# Patient Record
Sex: Male | Born: 1955 | Race: Black or African American | Hispanic: No | Marital: Single | Smoking: Current every day smoker
Health system: Southern US, Community
[De-identification: ages and names within clinical notes are randomized; demographics above are authoritative.]

## PROBLEM LIST (undated history)

## (undated) DIAGNOSIS — C801 Malignant (primary) neoplasm, unspecified: Secondary | ICD-10-CM

## (undated) DIAGNOSIS — I10 Essential (primary) hypertension: Secondary | ICD-10-CM

## (undated) DIAGNOSIS — E119 Type 2 diabetes mellitus without complications: Secondary | ICD-10-CM

## (undated) HISTORY — PX: APPENDECTOMY: SHX54

---

## 2004-06-01 ENCOUNTER — Ambulatory Visit: Payer: Self-pay | Admitting: *Deleted

## 2004-06-01 ENCOUNTER — Ambulatory Visit: Payer: Self-pay | Admitting: Family Medicine

## 2005-08-07 ENCOUNTER — Emergency Department (HOSPITAL_COMMUNITY): Admission: EM | Admit: 2005-08-07 | Discharge: 2005-08-07 | Payer: Self-pay | Admitting: Emergency Medicine

## 2007-07-18 ENCOUNTER — Emergency Department (HOSPITAL_COMMUNITY): Admission: EM | Admit: 2007-07-18 | Discharge: 2007-07-18 | Payer: Self-pay | Admitting: Emergency Medicine

## 2007-10-23 ENCOUNTER — Emergency Department (HOSPITAL_COMMUNITY): Admission: EM | Admit: 2007-10-23 | Discharge: 2007-10-23 | Payer: Self-pay | Admitting: Emergency Medicine

## 2011-08-12 ENCOUNTER — Ambulatory Visit: Payer: Self-pay

## 2011-08-12 DIAGNOSIS — Z0289 Encounter for other administrative examinations: Secondary | ICD-10-CM

## 2011-12-12 ENCOUNTER — Ambulatory Visit (INDEPENDENT_AMBULATORY_CARE_PROVIDER_SITE_OTHER): Payer: Self-pay | Admitting: Physician Assistant

## 2011-12-12 DIAGNOSIS — Z135 Encounter for screening for eye and ear disorders: Secondary | ICD-10-CM

## 2011-12-12 DIAGNOSIS — Z0100 Encounter for examination of eyes and vision without abnormal findings: Secondary | ICD-10-CM

## 2011-12-12 NOTE — Progress Notes (Signed)
  Subjective:    Patient ID: [name], male    DOB: 03-27-1956, 56 y.o.   MRN: [id number]  HPI  Pt presents for vision screen.  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  He is here today with his glasses to finish his DOT examination.  Vision reviewed.    Review of Systems     Objective:   Physical Exam        Assessment & Plan:  DOT exam reviewed.  Formed filled out, will be scanned in.  Ok for 2y DOT from exam date of 08/12/11.

## 2012-09-10 ENCOUNTER — Ambulatory Visit: Payer: Self-pay | Admitting: Internal Medicine

## 2012-09-10 LAB — DOT URINE DIP
Blood: NEGATIVE
Specific Gravity: 1.015 (ref 1.003–1.030)

## 2012-11-27 ENCOUNTER — Telehealth: Payer: Self-pay

## 2012-11-27 NOTE — Telephone Encounter (Signed)
DOT long form faxed with confirmation.

## 2012-11-27 NOTE — Telephone Encounter (Signed)
Pt states that he spoke with someone yesterday regarding faxing his DOT long form to his employer [employer], but it has not been sent as of yet. Pt states that this is something that needs to be done asap.  [facility] logistics attn:Mack  [phone]

## 2013-03-31 ENCOUNTER — Encounter (HOSPITAL_COMMUNITY): Payer: Self-pay | Admitting: Cardiology

## 2013-03-31 ENCOUNTER — Emergency Department (HOSPITAL_COMMUNITY)
Admission: EM | Admit: 2013-03-31 | Discharge: 2013-03-31 | Disposition: A | Discharge status: Home / Self Care | Payer: Self-pay | Attending: Emergency Medicine | Admitting: Emergency Medicine

## 2013-03-31 DIAGNOSIS — E1169 Type 2 diabetes mellitus with other specified complication: Secondary | ICD-10-CM | POA: Insufficient documentation

## 2013-03-31 DIAGNOSIS — R109 Unspecified abdominal pain: Secondary | ICD-10-CM | POA: Insufficient documentation

## 2013-03-31 DIAGNOSIS — F17200 Nicotine dependence, unspecified, uncomplicated: Secondary | ICD-10-CM | POA: Insufficient documentation

## 2013-03-31 DIAGNOSIS — N476 Balanoposthitis: Secondary | ICD-10-CM | POA: Insufficient documentation

## 2013-03-31 DIAGNOSIS — N489 Disorder of penis, unspecified: Secondary | ICD-10-CM | POA: Insufficient documentation

## 2013-03-31 DIAGNOSIS — R739 Hyperglycemia, unspecified: Secondary | ICD-10-CM

## 2013-03-31 DIAGNOSIS — N481 Balanitis: Secondary | ICD-10-CM

## 2013-03-31 DIAGNOSIS — N472 Paraphimosis: Secondary | ICD-10-CM

## 2013-03-31 DIAGNOSIS — N471 Phimosis: Secondary | ICD-10-CM | POA: Insufficient documentation

## 2013-03-31 HISTORY — DX: Type 2 diabetes mellitus without complications: E11.9

## 2013-03-31 LAB — GLUCOSE, CAPILLARY: Glucose-Capillary: 374 mg/dL — ABNORMAL HIGH (ref 70–99)

## 2013-03-31 MED ORDER — INSULIN ASPART 100 UNIT/ML ~~LOC~~ SOLN
5.0000 [IU] | Freq: Once | SUBCUTANEOUS | Status: AC
  Administered 2013-03-31: 5 [IU] via SUBCUTANEOUS
  Filled 2013-03-31: qty 1

## 2013-03-31 MED ORDER — FLUCONAZOLE 100 MG PO TABS
150.0000 mg | ORAL_TABLET | Freq: Once | ORAL | Status: AC
  Administered 2013-03-31: 150 mg via ORAL
  Filled 2013-03-31: qty 2

## 2013-03-31 MED ORDER — CLOTRIMAZOLE 1 % EX CREA: TOPICAL_CREAM | CUTANEOUS | Status: DC

## 2013-03-31 NOTE — ED Provider Notes (Signed)
CSN: [id number]     Arrival date & time 03/31/13  1701 History     First MD Initiated Contact with Patient 03/31/13 1829     Chief Complaint  Patient presents with  . Hyperglycemia  . Groin Pain   (Consider location/radiation/quality/duration/timing/severity/associated sxs/prior Treatment) HPI Comments: Pt w/ hx of DM now w/ penile pain and swelling. States 3 days ago was in [place] - noted likely yeast infection of penis - white plaques, irritation. Pt is not circumcised. Retracted foreskin to clean and couldn't get foreskin reduced. Noted worsening pain and swelling x 3 days. Admits to polyuria. No dysuria/hematuria or penile discharge. No abd pain/n/v/d/c. No fever. No other sx. + hx of DM - noted worsening hyperglycemia. Admits to polydipsia.   Patient is a 57 y.o. male presenting with general illness. The history is provided by the patient. No language interpreter was used.  Illness Location:  GU Severity:  Moderate Onset quality:  Sudden Duration:  3 days Timing:  Constant Progression:  Worsening Chronicity:  New Associated symptoms: no abdominal pain, no chest pain, no congestion, no cough, no diarrhea, no fever, no headaches, no nausea, no rash, no shortness of breath, no sore throat and no vomiting     Past Medical History  Diagnosis Date  . Diabetes mellitus without complication    Past Surgical History  Procedure Laterality Date  . Appendectomy     History reviewed. No pertinent family history. History  Substance Use Topics  . Smoking status: Current Every Day Smoker    Types: Cigarettes  . Smokeless tobacco: Not on file  . Alcohol Use: No    Review of Systems  Constitutional: Negative for fever and chills.  HENT: Negative for congestion and sore throat.   Respiratory: Negative for cough and shortness of breath.   Cardiovascular: Negative for chest pain and leg swelling.  Gastrointestinal: Negative for nausea, vomiting, abdominal pain, diarrhea and constipation.    Genitourinary: Positive for penile swelling and penile pain. Negative for dysuria, urgency, frequency, hematuria, decreased urine volume, discharge, scrotal swelling and testicular pain.  Skin: Negative for color change and rash.  Neurological: Negative for dizziness and headaches.  Psychiatric/Behavioral: Negative for confusion and agitation.  All other systems reviewed and are negative.    Allergies  Review of patient's allergies indicates no known allergies.  Home Medications  No current outpatient prescriptions on file. BP 128/85  Pulse 74  Temp(Src) 98.4 F (36.9 C) (Oral)  Resp 19  SpO2 97% Physical Exam  Constitutional: He is oriented to person, place, and time. He appears well-developed and well-nourished. No distress.  HENT:  Head: Normocephalic and atraumatic.  Eyes: EOM are normal. Pupils are equal, round, and reactive to light.  Neck: Normal range of motion. Neck supple.  Cardiovascular: Normal rate, regular rhythm and normal heart sounds.   Pulmonary/Chest: Effort normal and breath sounds normal. No respiratory distress.  Abdominal: Soft. He exhibits no distension. There is no tenderness.  Genitourinary: Testes normal.    Uncircumcised. Paraphimosis present.  Musculoskeletal: Normal range of motion. He exhibits no edema.  Neurological: He is alert and oriented to person, place, and time.  Skin: Skin is warm and dry.  Psychiatric: He has a normal mood and affect. His behavior is normal.    ED Course   Procedures (including critical care time)  Labs Reviewed  GLUCOSE, CAPILLARY - Abnormal; Notable for the following:    Glucose-Capillary 374 (*)    All other components within normal limits  No results found. No diagnosis found.  MDM  Exam as above, NAD, well appearing, non toxic, paraphimosis reduced w/out event. Exam c/w candida topical infection. Pt denies systemic sx or scrotal pain. Doubt epididymitis, STD, staph/strep infection. Given 150mg   diflucan in ED. Glucose 374 and pt well appearing, doubt DKA or HHNKS. Given 5 units SQ insulin. Stable for d/c home. Given clotrimazole topical and urology follow up. D/c home in good and stable condition. No indication for additional labs or imaging at this time. Given return precautions.   1. Hyperglycemia   2. Paraphimosis   3. Balanitis    New Prescriptions   CLOTRIMAZOLE (LOTRIMIN) 1 % CREAM    Apply to affected area 2 times daily until symptoms resolve   [facility] [address] [place] [address] [place] [address] [phone] Schedule an appointment as soon as possible for a visit     Audelia Hives, MD 03/31/13 [phone]

## 2013-03-31 NOTE — ED Notes (Signed)
Pt reports that he checked his cbg this morning and it was around 400. States that he also has noticed a possible infection in his penis. States that he is uncircumcised and his foreskin is swollen.

## 2013-04-02 NOTE — ED Provider Notes (Signed)
Medical screening examination/treatment/procedure(s) were performed by non-physician practitioner and as supervising physician I was immediately available for consultation/collaboration.  Patient with paraphimosis and balanitis. Paraphimosis reduced, treated with antifungals.  Gilda Crease, MD 04/02/13 [phone]

## 2013-07-07 ENCOUNTER — Encounter (HOSPITAL_COMMUNITY): Payer: Self-pay | Admitting: Emergency Medicine

## 2013-07-07 ENCOUNTER — Emergency Department (HOSPITAL_COMMUNITY): Payer: Self-pay

## 2013-07-07 ENCOUNTER — Inpatient Hospital Stay (HOSPITAL_COMMUNITY)
Admission: EM | Admit: 2013-07-07 | Discharge: 2013-07-11 | DRG: 605 | Disposition: A | Discharge status: Home Health | Payer: Self-pay | Attending: Internal Medicine | Admitting: Internal Medicine

## 2013-07-07 DIAGNOSIS — K5900 Constipation, unspecified: Secondary | ICD-10-CM | POA: Diagnosis not present

## 2013-07-07 DIAGNOSIS — Y9269 Other specified industrial and construction area as the place of occurrence of the external cause: Secondary | ICD-10-CM

## 2013-07-07 DIAGNOSIS — M6282 Rhabdomyolysis: Secondary | ICD-10-CM | POA: Diagnosis present

## 2013-07-07 DIAGNOSIS — IMO0001 Reserved for inherently not codable concepts without codable children: Secondary | ICD-10-CM | POA: Diagnosis present

## 2013-07-07 DIAGNOSIS — F1910 Other psychoactive substance abuse, uncomplicated: Secondary | ICD-10-CM | POA: Diagnosis present

## 2013-07-07 DIAGNOSIS — E1165 Type 2 diabetes mellitus with hyperglycemia: Secondary | ICD-10-CM

## 2013-07-07 DIAGNOSIS — IMO0002 Reserved for concepts with insufficient information to code with codable children: Principal | ICD-10-CM | POA: Diagnosis present

## 2013-07-07 DIAGNOSIS — S8780XA Crushing injury of unspecified lower leg, initial encounter: Secondary | ICD-10-CM

## 2013-07-07 DIAGNOSIS — S8781XA Crushing injury of right lower leg, initial encounter: Secondary | ICD-10-CM

## 2013-07-07 DIAGNOSIS — S8991XS Unspecified injury of right lower leg, sequela: Secondary | ICD-10-CM

## 2013-07-07 DIAGNOSIS — Z8249 Family history of ischemic heart disease and other diseases of the circulatory system: Secondary | ICD-10-CM

## 2013-07-07 DIAGNOSIS — F1911 Other psychoactive substance abuse, in remission: Secondary | ICD-10-CM | POA: Diagnosis present

## 2013-07-07 DIAGNOSIS — F17200 Nicotine dependence, unspecified, uncomplicated: Secondary | ICD-10-CM | POA: Diagnosis present

## 2013-07-07 DIAGNOSIS — Y990 Civilian activity done for income or pay: Secondary | ICD-10-CM

## 2013-07-07 DIAGNOSIS — R030 Elevated blood-pressure reading, without diagnosis of hypertension: Secondary | ICD-10-CM | POA: Diagnosis present

## 2013-07-07 DIAGNOSIS — Z720 Tobacco use: Secondary | ICD-10-CM

## 2013-07-07 DIAGNOSIS — Z833 Family history of diabetes mellitus: Secondary | ICD-10-CM

## 2013-07-07 DIAGNOSIS — S8991XA Unspecified injury of right lower leg, initial encounter: Secondary | ICD-10-CM

## 2013-07-07 LAB — URINALYSIS, ROUTINE W REFLEX MICROSCOPIC
Bilirubin Urine: NEGATIVE
Hgb urine dipstick: NEGATIVE
Nitrite: NEGATIVE
Protein, ur: NEGATIVE mg/dL
Specific Gravity, Urine: 1.022 (ref 1.005–1.030)
Urobilinogen, UA: 0.2 mg/dL (ref 0.0–1.0)

## 2013-07-07 LAB — CBC WITH DIFFERENTIAL/PLATELET
Basophils Absolute: 0.1 10*3/uL (ref 0.0–0.1)
Basophils Relative: 1 % (ref 0–1)
Eosinophils Absolute: 0.2 10*3/uL (ref 0.0–0.7)
Eosinophils Relative: 2 % (ref 0–5)
HCT: 41.7 % (ref 39.0–52.0)
Hemoglobin: 14.6 g/dL (ref 13.0–17.0)
Lymphocytes Relative: 41 % (ref 12–46)
Lymphs Abs: 3.3 10*3/uL (ref 0.7–4.0)
MCH: 27.7 pg (ref 26.0–34.0)
MCHC: 35 g/dL (ref 30.0–36.0)
MCV: 79 fL (ref 78.0–100.0)
Monocytes Relative: 6 % (ref 3–12)
Neutro Abs: 3.9 10*3/uL (ref 1.7–7.7)
Neutrophils Relative %: 50 % (ref 43–77)
RBC: 5.28 MIL/uL (ref 4.22–5.81)
WBC: 8 10*3/uL (ref 4.0–10.5)

## 2013-07-07 LAB — CK: Total CK: 333 U/L — ABNORMAL HIGH (ref 7–232)

## 2013-07-07 LAB — GLUCOSE, CAPILLARY
Glucose-Capillary: 280 mg/dL — ABNORMAL HIGH (ref 70–99)
Glucose-Capillary: 183 mg/dL — ABNORMAL HIGH (ref 70–99)
Glucose-Capillary: 176 mg/dL — ABNORMAL HIGH (ref 70–99)

## 2013-07-07 LAB — RAPID URINE DRUG SCREEN, HOSP PERFORMED
Barbiturates: NOT DETECTED
Opiates: NOT DETECTED
Tetrahydrocannabinol: NOT DETECTED

## 2013-07-07 LAB — PROTIME-INR
INR: 0.89 (ref 0.00–1.49)
Prothrombin Time: 11.9 seconds (ref 11.6–15.2)

## 2013-07-07 LAB — URINE MICROSCOPIC-ADD ON

## 2013-07-07 MED ORDER — ACETAMINOPHEN 650 MG RE SUPP: 650.0000 mg | Freq: Four times a day (QID) | RECTAL | Status: DC | PRN

## 2013-07-07 MED ORDER — PNEUMOCOCCAL VAC POLYVALENT 25 MCG/0.5ML IJ INJ
0.5000 mL | INJECTION | INTRAMUSCULAR | Status: AC
  Administered 2013-07-08: 0.5 mL via INTRAMUSCULAR
  Filled 2013-07-07: qty 0.5

## 2013-07-07 MED ORDER — ONDANSETRON HCL 4 MG PO TABS: 4.0000 mg | ORAL_TABLET | Freq: Four times a day (QID) | ORAL | Status: DC | PRN

## 2013-07-07 MED ORDER — OXYCODONE-ACETAMINOPHEN 5-325 MG PO TABS
1.0000 | ORAL_TABLET | Freq: Four times a day (QID) | ORAL | Status: DC | PRN
  Administered 2013-07-07 – 2013-07-10 (×6): 2 via ORAL
  Filled 2013-07-07 (×6): qty 2

## 2013-07-07 MED ORDER — INSULIN ASPART 100 UNIT/ML ~~LOC~~ SOLN
0.0000 [IU] | Freq: Three times a day (TID) | SUBCUTANEOUS | Status: DC
  Administered 2013-07-07: 3 [IU] via SUBCUTANEOUS
  Administered 2013-07-08 (×2): 5 [IU] via SUBCUTANEOUS

## 2013-07-07 MED ORDER — ALUM & MAG HYDROXIDE-SIMETH 200-200-20 MG/5ML PO SUSP: 30.0000 mL | Freq: Four times a day (QID) | ORAL | Status: DC | PRN

## 2013-07-07 MED ORDER — HYDROMORPHONE HCL PF 1 MG/ML IJ SOLN
1.0000 mg | Freq: Once | INTRAMUSCULAR | Status: AC
  Administered 2013-07-07: 1 mg via INTRAVENOUS
  Filled 2013-07-07: qty 1

## 2013-07-07 MED ORDER — HYDROMORPHONE HCL PF 1 MG/ML IJ SOLN
0.5000 mg | INTRAMUSCULAR | Status: DC | PRN
  Administered 2013-07-07: 0.5 mg via INTRAVENOUS
  Filled 2013-07-07: qty 1

## 2013-07-07 MED ORDER — ACETAMINOPHEN 325 MG PO TABS: 650.0000 mg | ORAL_TABLET | Freq: Four times a day (QID) | ORAL | Status: DC | PRN

## 2013-07-07 MED ORDER — OXYCODONE HCL 5 MG PO TABS: 5.0000 mg | ORAL_TABLET | ORAL | Status: DC | PRN

## 2013-07-07 MED ORDER — LACTATED RINGERS IV SOLN
INTRAVENOUS | Status: DC
  Administered 2013-07-07: 1 mL via INTRAVENOUS
  Administered 2013-07-07 – 2013-07-09 (×6): via INTRAVENOUS
  Administered 2013-07-09: 75 mL/h via INTRAVENOUS
  Administered 2013-07-10: 13:00:00 via INTRAVENOUS

## 2013-07-07 MED ORDER — ONDANSETRON HCL 4 MG/2ML IJ SOLN: 4.0000 mg | Freq: Four times a day (QID) | INTRAMUSCULAR | Status: DC | PRN

## 2013-07-07 NOTE — ED Notes (Signed)
Pt transported to 5N room 30 via stretcher by Kandee Keen EMT, report given to [employer]

## 2013-07-07 NOTE — Progress Notes (Signed)
[facility]th[facility]edic [facility]uma [facility] follow up  Pt now on 5N Remains comfortable R leg in compressive dressing  Stable Pain tolerable  Exam  BP 142/91  Pulse 83  Temp(Src) 97.8 F (36.6 C) (Oral)  Resp 16  Ht 6\' 2"  (1.88 m)  Wt 109.8 kg (242 lb 1 oz)  BMI 31.07 kg/m2  SpO2 100%  Labs  Results for ALEXANDERJAMES, BERG (MRN [id number]) as of 07/07/2013 16:19  Ref. Range 07/07/2013 11:52  CK Total Latest Range: 7-232 U/L 333 (H)    Gen: appears comfortable Ext:      Right Leg   Ace wrap  Compartments feel no more full than earlier this pm  Distal motor and sensory functions remain intact  + DP pulse  No pain with passive stretch    A/P  Continue with current plan of care Will allow for clear liquid diet SSI as pt is diabetic  Continue to ice and elevate Continue observation  Continue IVF for increased CK   Mearl Latin, PA-C [facility] [phone] (P) 07/07/2013 4:19 PM

## 2013-07-07 NOTE — Progress Notes (Signed)
Orthopedic Tech Progress Note Patient Details:  [name] Jul 14, 1956 [id number] Received call from Dr. Montez Morita to apply webrill and ace wrap to patient's Right LE from foot to thigh for compression. Patient tolerated dressing well.  Ortho Devices Type of Ortho Device: Ace wrap Ortho Device/Splint Location: Right LE Ortho Device/Splint Interventions: Application   Asia R Thompson 07/07/2013, 12:45 PM

## 2013-07-07 NOTE — ED Notes (Signed)
Family updated as to patient's status. Pt called  And updated sister on his condition

## 2013-07-07 NOTE — ED Notes (Signed)
Pt given a urinal per his request, pt wants to urinate prior to receiving pain medication since this may be a workman's comp case, pt unable to urinate at this time, Dilaudid 1 mg given prior to collecting a urine sample

## 2013-07-07 NOTE — ED Notes (Signed)
UA obtained, no orders at this time for sample
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
## 2013-07-07 NOTE — ED Notes (Signed)
Pt's belongings given to pt's sister Zella Ball Elster, pt requested to keep his eye glasses

## 2013-07-07 NOTE — Progress Notes (Signed)
[facility] H&P  Pt seen and examined  Full consult dictated: [phone]  Assessment and Plan  57 y/o black male with work related R leg injury  1. R leg injury, swelling  No acute signs of compartment syndrome at this time  Will admit pt for observation to make sure he does not develop one  Will apply compressive wrap  Ice and elevate to level of heart  Toe and ankle motion as tolerated to help with swelling  Pt very comfortable at this time   Will keep NPO until reevaluation in a few hours  2. Dispo  Overnight observation  Likely home tomorrow as long as pt remains stable    Mearl Latin, PA-C [facility] [phone] (P) 07/07/2013 12:08 PM

## 2013-07-07 NOTE — ED Notes (Signed)
CBG 280 

## 2013-07-07 NOTE — Progress Notes (Signed)
I saw and examined the patient with Mr. Renae Fickle, communicating the findings and plan noted above.  RLE no pain with passive stretch  Active motion  Will remain NPO per patient concerns and willingness Continue NV checks   Myrene Galas, MD [facility] [phone] (p)
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
## 2013-07-08 DIAGNOSIS — F17200 Nicotine dependence, unspecified, uncomplicated: Secondary | ICD-10-CM

## 2013-07-08 DIAGNOSIS — IMO0001 Reserved for inherently not codable concepts without codable children: Secondary | ICD-10-CM

## 2013-07-08 DIAGNOSIS — Z720 Tobacco use: Secondary | ICD-10-CM | POA: Diagnosis present

## 2013-07-08 DIAGNOSIS — E1165 Type 2 diabetes mellitus with hyperglycemia: Secondary | ICD-10-CM | POA: Diagnosis present

## 2013-07-08 DIAGNOSIS — S8991XA Unspecified injury of right lower leg, initial encounter: Secondary | ICD-10-CM

## 2013-07-08 DIAGNOSIS — F1910 Other psychoactive substance abuse, uncomplicated: Secondary | ICD-10-CM | POA: Diagnosis present

## 2013-07-08 DIAGNOSIS — S8780XA Crushing injury of unspecified lower leg, initial encounter: Secondary | ICD-10-CM

## 2013-07-08 DIAGNOSIS — Z794 Long term (current) use of insulin: Secondary | ICD-10-CM | POA: Diagnosis present

## 2013-07-08 LAB — RENAL FUNCTION PANEL
Albumin: 3.6 g/dL (ref 3.5–5.2)
BUN: 8 mg/dL (ref 6–23)
CO2: 27 mEq/L (ref 19–32)
GFR calc Af Amer: >90 mL/min (ref >90)
Glucose, Bld: 283 mg/dL — ABNORMAL HIGH (ref 70–99)
Potassium: 3.7 mEq/L (ref 3.5–5.1)
Sodium: 134 mEq/L — ABNORMAL LOW (ref 135–145)

## 2013-07-08 LAB — URINALYSIS, ROUTINE W REFLEX MICROSCOPIC
Ketones, ur: NEGATIVE mg/dL
Leukocytes, UA: NEGATIVE
Nitrite: NEGATIVE
Specific Gravity, Urine: 1.01 (ref 1.005–1.030)
Urobilinogen, UA: 1 mg/dL (ref 0.0–1.0)
pH: 6.5 (ref 5.0–8.0)

## 2013-07-08 LAB — GLUCOSE, CAPILLARY: Glucose-Capillary: 211 mg/dL — ABNORMAL HIGH (ref 70–99)

## 2013-07-08 LAB — HEMOGLOBIN A1C
Hgb A1c MFr Bld: 14.9 % — ABNORMAL HIGH (ref <5.7)
Mean Plasma Glucose: 381 mg/dL — ABNORMAL HIGH (ref <117)

## 2013-07-08 LAB — CK
Total CK: 1156 U/L — ABNORMAL HIGH (ref 7–232)
Total CK: 923 U/L — ABNORMAL HIGH (ref 7–232)

## 2013-07-08 LAB — URINE MICROSCOPIC-ADD ON

## 2013-07-08 MED ORDER — FUROSEMIDE 10 MG/ML IJ SOLN
20.0000 mg | Freq: Two times a day (BID) | INTRAMUSCULAR | Status: AC
  Administered 2013-07-08 (×2): 20 mg via INTRAVENOUS
  Filled 2013-07-08 (×2): qty 2

## 2013-07-08 MED ORDER — INSULIN ASPART PROT & ASPART (70-30 MIX) 100 UNIT/ML ~~LOC~~ SUSP
25.0000 [IU] | Freq: Every day | SUBCUTANEOUS | Status: DC
  Administered 2013-07-09: 25 [IU] via SUBCUTANEOUS
  Filled 2013-07-08 (×2): qty 10

## 2013-07-08 MED ORDER — INSULIN ASPART PROT & ASPART (70-30 MIX) 100 UNIT/ML ~~LOC~~ SUSP
15.0000 [IU] | Freq: Every day | SUBCUTANEOUS | Status: DC
  Administered 2013-07-08: 15 [IU] via SUBCUTANEOUS

## 2013-07-08 NOTE — Plan of Care (Signed)
Problem: Food- and Nutrition-Related Knowledge Deficit (NB-1.1) Goal: Nutrition education Formal process to instruct or train a patient/client in a skill or to impart knowledge to help patients/clients voluntarily manage or modify food choices and eating behavior to maintain or improve health. Outcome: Completed/Met Date Met:  07/08/13  Nutrition Education Note  Pt identified on the Malnutrition Screening Tool list for weight loss. Per pt he lost a lot of weight due to uncontrolled diabetes. Pt was urinating a lot and went to the doctor. Pt is now on insulin to try to get his blood sugars down. 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 Pt is taking a cooler with him on the road with healthier meal and snack options. Pt plans to walk around while truck is being unloaded.     Lab Results  Component Value Date    HGBA1C 14.9* 07/07/2013    RD provided "Carbohydrate Counting for People with Diabetes" handout from the Academy of Nutrition and Dietetics. Discussed different food groups and their effects on blood sugar, emphasizing carbohydrate-containing foods. Provided list of carbohydrates and recommended serving sizes of common foods.  Discussed importance of controlled and consistent carbohydrate intake throughout the day. Provided examples of ways to balance meals/snacks and encouraged intake of high-fiber, whole grain complex carbohydrates. Teach back method used.  Expect good compliance.  Body mass index is 31.07 kg/(m^2). Pt meets criteria for Obesity Class I based on current BMI.  Current diet order is CHO Modified, patient is consuming approximately 100% of meals at this time. Labs and medications reviewed. No further nutrition interventions warranted at this time. RD contact information provided. If additional nutrition issues arise, please re-consult RD.  Kendell Bane RD, LDN, CNSC [phone] Pager [phone] After  Hours Pager

## 2013-07-08 NOTE — Progress Notes (Addendum)
UR COMPLETED.   Patient changed to inpatient status r/t elevated CK, IVF, and IV lasix.
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
## 2013-07-08 NOTE — Evaluation (Signed)
Physical Therapy Evaluation Patient Details Name: [name] MRN: [id number] DOB: 07/27/1956 Today's Date: 07/08/2013 Time: 3086-5784 PT Time Calculation (min): 17 min  PT Assessment / Plan / Recommendation History of Present Illness  57 y/o male with work related R leg injury  Clinical Impression  Pt admitted with R leg injury. Pt currently with functional limitations due to the deficits listed below (see PT Problem List).  Pt will benefit from skilled PT to increase their independence and safety with mobility to allow discharge to the venue listed below.  Pt uncertain of d/c plans at this time as he lives in homeless shelter prior to admission.  Pt would  benefit from [facility] upon d/c if assist unavailable from friends/family upon d/c.  Pt currently min assist during ambulation due to some unsteadiness and limited by increased R LE pain.     PT Assessment  Patient needs continued PT services    Follow Up Recommendations  Supervision for mobility/OOB;SNF    Does the patient have the potential to tolerate intense rehabilitation      Barriers to Discharge        Equipment Recommendations  Rolling walker with 5" wheels (or possibly crutches, TBD)    Recommendations for Other Services     Frequency Min 4X/week    Precautions / Restrictions Precautions Precautions: Fall Restrictions Other Position/Activity Restrictions: WBAT R LE   Pertinent Vitals/Pain 6/10 R leg, premedicated, pt left EOB with MD however discussed ice and elevation (upon returning RW to room later, pt assisted into bathroom and agreed to pull cord for assist out of bathroom, family also present and aware of safety concerns, RN notified pt on toilet and will call out when finished)      Mobility  Bed Mobility Bed Mobility: Supine to Sit Supine to Sit: 6: Modified independent (Device/Increase time) Transfers Transfers: Sit to Stand;Stand to Sit Sit to Stand: 4: Min guard;With upper extremity  assist;From bed Stand to Sit: 4: Min guard;With upper extremity assist;To bed Details for Transfer Assistance: verbal cues for safe technique Ambulation/Gait Ambulation/Gait Assistance: 4: Min assist Ambulation Distance (Feet): 60 Feet Assistive device: Rolling walker Ambulation/Gait Assistance Details: occasional min assist for unsteadiness esp during turning, multiple short standing rest breaks due to fatigue and pain, pt unable to tolerate even PWB through R LE instead demonstrating TDWB and then NWB due to increased pain Gait Pattern: Step-to pattern Gait velocity: decreased General Gait Details: verbal cues for step to gait pattern for pain control, step length, RW distance    Exercises     PT Diagnosis: Difficulty walking;Acute pain  PT Problem List: Decreased strength;Decreased range of motion;Decreased balance;Decreased mobility;Pain;Decreased knowledge of use of DME PT Treatment Interventions: DME instruction;Gait training;Functional mobility training;Therapeutic activities;Therapeutic exercise;Patient/family education;Balance training     PT Goals(Current goals can be found in the care plan section) Acute Rehab PT Goals PT Goal Formulation: With patient Time For Goal Achievement: 07/15/13 Potential to Achieve Goals: Good  Visit Information  Last PT Received On: 07/08/13 Assistance Needed: +1 History of Present Illness: 57 y/o male with work related R leg injury       Prior Functioning  Home Living Family/patient expects to be discharged to:: Shelter/Homeless Additional Comments: unknown d/c plans at this time Prior Function Level of Independence: Independent Communication Communication: No difficulties    Cognition  Cognition Arousal/Alertness: Awake/alert Behavior During Therapy: WFL for tasks assessed/performed Overall Cognitive Status: Within Functional Limits for tasks assessed    Extremity/Trunk Assessment Lower Extremity Assessment  Lower Extremity  Assessment: RLE deficits/detail RLE Deficits / Details: unable to perform full AROM of ankle and knee due to pain, lower leg also ace wrapped RLE: Unable to fully assess due to pain   Balance    End of Session PT - End of Session Activity Tolerance: Patient tolerated treatment well Patient left: in bed;with call bell/phone within reach;with family/visitor present;Other (comment) (with MD) Nurse Communication: Mobility status  GP     Georgena Weisheit,KATHrine E 07/08/2013, 3:35 PM Zenovia Jarred, PT, DPT 07/08/2013 Pager: [phone]

## 2013-07-08 NOTE — Progress Notes (Signed)
Orthopaedic Trauma Service Progress Note  Subjective  Doing ok this am Pain tolerable No significant changes  On review of orders noted that pt was changed to CHO modified diet overnight.    Pt voiding well Urine dark yellow  Review of Systems  Respiratory: Negative for shortness of breath and wheezing.   Cardiovascular: Negative for chest pain and palpitations.  Gastrointestinal: Negative for nausea, vomiting and abdominal pain.  Genitourinary: Negative for dysuria and hematuria.  Neurological: Negative for tingling and sensory change.     Objective   BP 119/92  Pulse 75  Temp(Src) 98.9 F (37.2 C) (Oral)  Resp 18  Ht 6\' 2"  (1.88 m)  Wt 109.8 kg (242 lb 1 oz)  BMI 31.07 kg/m2  SpO2 97%  Intake/Output     11/03 0701 - 11/04 0700 11/04 0701 - 11/05 0700   I.V. (mL/kg) 40 (0.4)    Total Intake(mL/kg) 40 (0.4)    Urine (mL/kg/hr) 850    Total Output 850     Net -810            Labs  Results for DMARIO, RUSSOM (MRN [id number]) as of 07/08/2013 08:24  Ref. Range 07/07/2013 11:52 07/08/2013 05:27  CK Total Latest Range: 7-232 U/L 333 (H) 1156 (H)   CBG (last 3)   Recent Labs  07/07/13 1631 07/07/13 2132 07/08/13 0651  GLUCAP 183* 176* 246*      Exam  Gen:  Awake and alert, resting comfortably in bed, NAD Lungs: clear, unlabored Cardiac: RRR Abd: soft, NTND Ext:       Right Lower Extremity    Dressing c/d/i  Swelling stable, no changes  Distal motor and sensory functions intact  Ext warm  + DP pulse  No pain with passive stretching  Compartments full but compressible and no change in pain with palpation     Assessment and Plan   POD/HD#: 1   57 y/o male s/p work-related R leg injury   1. R leg pain and swelling/ elevated CK/early rhabdomyolysis   No clinical signs of compartments syndrome  Swelling and pain related to severe muscle injury   Continue with aggressive fluid hydration   Strict I&O monitoring  Ice and elevate  PT  eval  Serial CK checks  Will check urine myoglobin  Check renal function panel  Will add low dose lasix x 2 doses to encourage adequate output    Continue with inpatient monitoring    Pt lives at a shelter [employer])  2. DM  Continue with SSI  3. FEN  LR @ 150 cc/hr  CHO modified diet  Strict I&O's  4. DVT/PE prophylaxis  Foot pumps   5. Pain  PO meds  Tylenol  No NSAIDs, trying to avoid additional renal insult  6. Dispo  Continue to monitor  Hydrate, hydrate  Therapies   Mearl Latin, PA-C [facility] [phone] (P) 07/08/2013 8:22 AM

## 2013-07-08 NOTE — H&P (Signed)
[name], [name] NO.:  [id number]  MEDICAL RECORD NO.:  [id number]  LOCATION:  [id number]                        FACILITY:  [facility]  PHYSICIAN:  [name], M.D. DATE OF BIRTH:  [birth year]  DATE OF ADMISSION:  07/07/2013 DATE OF DISCHARGE:                             HISTORY & PHYSICAL   DATE OF INJURY:  July 07, 2013.  Apparent crush injury, right leg.  HISTORY OF PRESENT ILLNESS:  [name] is a very pleasant 57 year old black male who was at work, helping disengage a trailer from the tractor.  Apparently, air line got tangled up in a drive shaft and subsequently, the patient was struck in the right leg.  It does not sound as if he was pinned or crushed at any point in time.  The patient had immediate onset of pain to this right leg.  He was brought to [facility] for evaluation.  Imaging of his right leg was negative for acute fracture, however, Orthopedics was consulted regarding right leg swelling and concerns for acute compartment syndrome.  Currently, the patient is in D33, complains of pain to his right leg, but is very comfortable.  He did receive some pain medicine at around 0930 and is doing very well now.  He denies any numbness or tingling in his right leg.  Denies any additional pain elsewhere.  No additional issues are noted.  The patient also feels that his swelling has decreased since he has been here and been off his leg as his pain decreased as well. Again, activity makes his pain worse.  Rest and pain medication has alleviated his pain.  PAST MEDICAL HISTORY:  Notable for diabetes, left leg fracture at the age of 57, treated with casting.  PAST SURGICAL HISTORY:  Notable for appendectomy.  FAMILY MEDICAL HISTORY:  Noncontributory.  SOCIAL HISTORY:  The patient does smoke about half pack a day.  Does not drink.  Works for a Nurse, children's.  Lives in [place].  ALLERGIES:  No known drug allergies.  MEDICATIONS:   Prior to admission include Glucophage.  REVIEW OF SYSTEMS:  CONSTITUTIONAL:  No recent illnesses.  No weight loss.  No fevers, no chills.  HEENT:  No blurred vision.  No sore throat.  CARDIOVASCULAR:  No palpitations, no chest pain.  LUNGS:  No shortness of breath.  No wheezing.  GI:  No abdominal pain, no nausea, vomiting, no diarrhea.  MUSCULOSKELETAL:  Positive for right leg swelling.  No changes in motor or sensory functions.  PHYSICAL EXAMINATION:  VITAL SIGNS:  Temperature 97.5, pulse 63, respirations 17, and 100% on room air, BP is 116/69. GENERAL:  The patient is awake, alert, in no acute distress, very pleasant, appears appropriate, and engaged in conversation. HEENT:  Head is atraumatic.  Extraocular muscles are intact.  Moist mucous membranes. NECK:  Supple.  No lymphadenopathy.  No spinous process tenderness.  No pain with range of motion. CARDIAC:  S1 and S2.  Regular rate and rhythm. LUNGS:  Clear bilaterally. ABDOMEN:  Soft, nontender.  Positive bowel sounds. MUSCULOSKELETAL:  Pelvis, no instability with evaluation. EXTREMITIES:  Bilateral upper extremities are free of any gross deformities.  Motor and sensory function is grossly  intact.  Palpable peripheral pulses are noted.  Again, no acute findings.  Left lower extremity is unremarkable.  Motor and sensory function intact.  Palpable dorsalis pedis pulse.  Skin, does have some peripheral vascular changes. No acute findings to the left leg.  Right lower extremity, upon initial inspection, there is pretty moderate swelling to the right lower leg. Hip and knee are unremarkable without any acute findings.  No swelling to the left thigh is appreciated.  Swelling to the lower leg distally to the ankle and foot.  His compartments are full, but nontender to palpation.  No pain out of proportion with passive stretching of his anterior, lateral, superficial, and deep posterior compartments.  Deep peroneal nerve, superficial  peroneal nerve, and tibial nerve sensory functions are grossly intact.  EHL, FHL, anterior tibialis, posterior tibialis, peroneals, and gastrocsoleus complex motor function grossly intact as well.  Extremity is warm.  Palpable dorsalis pedis pulses appreciated.  No tenderness to palpation to the ankle or foot.  He is relatively nontender to palpation to his tibia from the knee distally to the ankle.  No instability appreciated about the right knee or ankle as well.  Again, hip is unremarkable.  No pain with axial loading or log rolling.  IMAGING STUDIES:  X-rays, 2 view of his right tib-fib demonstrates no acute fractures, but moderate soft tissue swelling.  ASSESSMENT AND PLAN:  A 57 year old black male status post injury to right leg.  1. Right leg pain and swelling.  Does not seem that this was a true     crush injury, however, he is swollen and there is a little concern     for compartment syndrome.  However, clinically at this time, it     does not appear that this is present.  The patient has a very     stable clinical exam given the lack of pain out of proportion with     passive stretching as well as lack of pain with palpation.  Motor     and sensory functions are intact distally as well, and the patient     has a good perfusion distally.  I do think that we should admit the     patient overnight for close observation where we can maintain     elevation of his right leg at the level of the heart as well as     apply a compressive wrap to his right leg as well as apply some ice     to his right leg to minimize any further swelling.  The patient is     in agreement with this plan.  We will keep the patient n.p.o. for     the time being, and we will reassess him in several hours to make     sure he is progressing in a positive fashion from a clinical     standpoint. 2. Diabetes.  Resume home meds once he is restarted on his oral diet. 3. Pain control.  Oral pain medications as  needed.  I will monitor his     pain medication usage closely to ensure that he is not requiring     increasing dose at an increased frequency which would be another     clinical indicator of developing compartment syndrome. 4. Disposition.  Admit to the Orthopedic Service.  The patient will be     admitted to [facility] [place] on close observation     for compartment syndrome.  Neurovascular checks every  hour for the     next 4 hours on the hour and then every 2 hours for the following 8     hours and then per routine.  As long as the patient remained     stable, we will likely be able to discharge him home tomorrow.     Mearl Latin, PA-C   ______________________________ [name], M.D.    KWP/MEDQ  D:  07/07/2013  T:  07/08/2013  Job:  [id number]

## 2013-07-09 DIAGNOSIS — M6282 Rhabdomyolysis: Secondary | ICD-10-CM | POA: Diagnosis present

## 2013-07-09 DIAGNOSIS — I10 Essential (primary) hypertension: Secondary | ICD-10-CM | POA: Diagnosis present

## 2013-07-09 DIAGNOSIS — IMO0001 Reserved for inherently not codable concepts without codable children: Secondary | ICD-10-CM

## 2013-07-09 DIAGNOSIS — R030 Elevated blood-pressure reading, without diagnosis of hypertension: Secondary | ICD-10-CM

## 2013-07-09 LAB — RENAL FUNCTION PANEL
BUN: 7 mg/dL (ref 6–23)
CO2: 29 mEq/L (ref 19–32)
Chloride: 101 mEq/L (ref 96–112)
Creatinine, Ser: 0.97 mg/dL (ref 0.50–1.35)
GFR calc Af Amer: >90 mL/min (ref >90)
GFR calc non Af Amer: >90 mL/min (ref >90)

## 2013-07-09 LAB — GLUCOSE, CAPILLARY
Glucose-Capillary: 230 mg/dL — ABNORMAL HIGH (ref 70–99)
Glucose-Capillary: 222 mg/dL — ABNORMAL HIGH (ref 70–99)
Glucose-Capillary: 160 mg/dL — ABNORMAL HIGH (ref 70–99)

## 2013-07-09 MED ORDER — INSULIN ASPART PROT & ASPART (70-30 MIX) 100 UNIT/ML ~~LOC~~ SUSP
20.0000 [IU] | Freq: Every day | SUBCUTANEOUS | Status: DC
  Administered 2013-07-09: 20 [IU] via SUBCUTANEOUS

## 2013-07-09 MED ORDER — POLYETHYLENE GLYCOL 3350 17 G PO PACK: 17.0000 g | PACK | Freq: Every day | ORAL | Status: DC | PRN

## 2013-07-09 NOTE — Progress Notes (Addendum)
[facility] PROGRESS NOTE  Tramel Westbrook Curtiss ZOX:[id number] DOB: 06-19-1956 DOA: 07/07/2013 PCP: No PCP Per Patient  Assessment/Plan  Right leg crush injury with mild rhabdomyolysis -  CPK trending down slowly with IVF -  Management per Dr. Carola Frost   T2DM, A1c 14.9 -  Continue metformin  -  AM CBG still elevated, increase PM 70/30 to 20 units -  Trend FS today and will adjust AM 70/30 dose tomorrow  Elevated blood pressure, may be secondary to pain or represent previously unidentified HTN -  Trend BP today -  Consider addition of BP medication if remaining elevated -  ACEI would generally be first line, however, could use alternative like norvasc for a few weeks until rhabdo has fully resolved.  Tobacco abuse: counselled cessation  Polysubstance abuse, in remission: clean for 2 months  Developing constipation, last BM yesterday -  Add prn miralax  Diet:  diabetic Access:  PIV IVF:  LR Proph:  Foot pump   HPI/Subjective:  Patient states that he continues to have pain in the right leg which prevents him from getting to the bathroom.  Unable to have BM this morning.  Denies SOB, fevers, chills, nausea, vomiting.  Making copious clear urine    Objective: Filed Vitals:   07/08/13 0453 07/08/13 1341 07/08/13 2118 07/09/13 0620  BP: 119/92 154/85 152/88 150/85  Pulse: 75 84 82 88  Temp: 98.9 F (37.2 C) 98.3 F (36.8 C) 98.6 F (37 C) 97.6 F (36.4 C)  TempSrc:  Oral    Resp: 18 20 18 18   Height:      Weight:      SpO2: 97% 100% 100% 99%    Intake/Output Summary (Last 24 hours) at 07/09/13 0956 Last data filed at 07/09/13 0559  Gross per 24 hour  Intake   5590 ml  Output   2800 ml  Net   2790 ml   Filed Weights   07/07/13 1148 07/07/13 1306  Weight: 107.956 kg (238 lb) 109.8 kg (242 lb 1 oz)    Exam:   General:  AAM No acute distress  HEENT:  NCAT, MMM  Cardiovascular:  RRR, nl S1, S2 no mrg, 2+ pulses, warm extremities  Respiratory:  Mildly  diminished bilateral BS without focal rales, wheezes, or rhonchi, no increased WOB  Abdomen:   NABS, soft, NT/ND  MSK:   Normal tone and bulk, trace bilateral LEE.  Right leg wrapped in ACE bandage  Neuro:  Grossly intact  Data Reviewed: Basic Metabolic Panel:  Recent Labs Lab 07/08/13 0845 07/09/13 0408  NA 134* 139  K 3.7 3.5  CL 97 101  CO2 27 29  GLUCOSE 283* 223*  BUN 8 7  CREATININE 0.93 0.97  CALCIUM 9.4 9.6  PHOS 2.8 3.9   Liver Function Tests:  Recent Labs Lab 07/08/13 0845 07/09/13 0408  ALBUMIN 3.6 3.1*   No results found for this basename: LIPASE, AMYLASE,  in the last 168 hours No results found for this basename: AMMONIA,  in the last 168 hours CBC:  Recent Labs Lab 07/07/13 1152  WBC 8.0  NEUTROABS 3.9  HGB 14.6  HCT 41.7  MCV 79.0  PLT 206   Cardiac Enzymes:  Recent Labs Lab 07/07/13 1152 07/08/13 0527 07/08/13 1440 07/09/13 0408  CKTOTAL 333* 1156* 923* 649*   BNP (last 3 results) No results found for this basename: PROBNP,  in the last 8760 hours CBG:  Recent Labs Lab 07/08/13 0651 07/08/13 1126 07/08/13 1654 07/08/13 2156  07/09/13 0629  GLUCAP 246* 249* 211* 261* 230*    No results found for this or any previous visit (from the past 240 hour(s)).   Studies: No results found.  Scheduled Meds: . insulin aspart protamine- aspart  20 Units Subcutaneous Q supper  . insulin aspart protamine- aspart  25 Units Subcutaneous Q breakfast   Continuous Infusions: . lactated ringers 150 mL/hr at 07/09/13 2956    Principal Problem:   Right leg injury Active Problems:   DM (diabetes mellitus), type 2, uncontrolled   Tobacco abuse   Polysubstance abuse, in remission    Time spent: 30 min    Sajad Glander, [facility]  [facility] Pager [phone]. If 7PM-7AM, please contact night-coverage at [email or web address], password [facility] 07/09/2013, 9:56 AM  LOS: 2 days

## 2013-07-09 NOTE — Progress Notes (Signed)
Clinical Social Work Department BRIEF PSYCHOSOCIAL ASSESSMENT 07/09/2013  Patient:  [name], [name]     Account Number:  [id number]     Admit date:  07/07/2013  Clinical Social Worker:  Harless Nakayama  Date/Time:  07/09/2013 12:00 N  Referred by:  Physician  Date Referred:  07/09/2013  Other Referral:   Interview type:  Patient Other interview type:   Pt sister also at bedside    PSYCHOSOCIAL DATA Living Status:  OTHER Admitted from facility:  [facility] Level of care:   Primary support name:  [name] [phone] Primary support relationship to patient:  SIBLING Degree of support available:   Pt has supportive family    CURRENT CONCERNS Current Concerns  Post-Acute Placement   Other Concerns:    SOCIAL WORK ASSESSMENT / PLAN CSW informed that pt is requesting to speak with social work and pt may be appropriate for SNF. CSW visited pt in room and pt sister [name] was also at bedside. Pt and family have concerns about post discharge plans. CSW inquired about pt insurance. Pt does not have insurance but this hospital stay is supposed to be covered by [employer]. CSW asked if worker's comp could be an option for coverage of SNF therapy since that is being recommended. Pt and pt sister believing that it should be covered but the lawyer invovled in the case will be visiting today. Pt sister to provide pt lawyer with CSW card. CSW did staff case with supervisor as well and if pt will not have coverage for ST rehab pt will be able to get LOG. CSW relayed this information to pt and pt sister. CSW explained referral process and possibility of being out of [place] if LOG is being used. Pt and pt sister understanding and hopeful that worker's comp will pay for the therapy. CSW did explain that facilities will want to have an idea of a post SNF dc plan. Pt sister informed CSW they are working on figuring something out but at the time no family members are able to take pt  home with them. Pt also had questions about insurance coverage and affordable care act. CSW explained that financial counselor has been contacted about pt situation. CSW informed that it would be best to ask these questions to the financial counselor as they would be more knowledgeable.    CSW to continue following. Will fax out as soon as CSW has heard from pt lawyer concerning coverage of SNF.   Assessment/plan status:  Psychosocial Support/Ongoing Assessment of Needs Other assessment/ plan:   Information/referral to community resources:   None needed at this time. Will provide SNF list when faxing out.    PATIENT'S/FAMILY'S RESPONSE TO PLAN OF CARE: Pt and pt sister thankful for CSW support and agreeable to SNF.       Uthman Mroczkowski, LCSWA [phone]

## 2013-07-09 NOTE — Progress Notes (Signed)
[facility]opaedic [facility]uma Service Progress Note  Subjective  Doing better  No new issues  Appreciate medicine consult for DM  SW consult to facilitate post hospital care     Objective   BP 150/85  Pulse 88  Temp(Src) 97.6 F (36.4 C) (Oral)  Resp 18  Ht 6\' 2"  (1.88 m)  Wt 109.8 kg (242 lb 1 oz)  BMI 31.07 kg/m2  SpO2 99%  Intake/Output     11/04 0701 - 11/05 0700 11/05 0701 - 11/06 0700   P.O. 800    I.V. (mL/kg) 5110 (46.5)    Total Intake(mL/kg) 5910 (53.8)    Urine (mL/kg/hr) 3200 (1.2)    Total Output 3200     Net +2710          Stool Occurrence 1 x      Labs  Results for PATE, AYLWARD (MRN [id number]) as of 07/09/2013 09:23  Ref. Range 07/08/2013 14:40 07/09/2013 04:08  CK Total Latest Range: 7-232 U/L 923 (H) 649 (H)   Results for RENDER, MARLEY (MRN [id number]) as of 07/09/2013 09:23  Ref. Range 07/09/2013 04:08  Sodium Latest Range: 135-145 mEq/L 139  Potassium Latest Range: 3.5-5.1 mEq/L 3.5  Chloride Latest Range: 96-112 mEq/L 101  CO2 Latest Range: 19-32 mEq/L 29  BUN Latest Range: 6-23 mg/dL 7  Creatinine Latest Range: 0.50-1.35 mg/dL 0.86  Calcium Latest Range: 8.4-10.5 mg/dL 9.6  GFR calc non Af Amer Latest Range: >90 mL/min >90  GFR calc Af Amer Latest Range: >90 mL/min >90  Glucose Latest Range: 70-99 mg/dL 578 (H)  Phosphorus Latest Range: 2.3-4.6 mg/dL 3.9  Albumin Latest Range: 3.5-5.2 g/dL 3.1 (L)   CBG (last 3)   Recent Labs  07/08/13 1654 07/08/13 2156 07/09/13 0629  GLUCAP 211* 261* 230*      Exam  Gen:  Awake and alert, resting comfortably in bed, NAD Lungs: clear, unlabored Cardiac: RRR Abd: soft, NTND Ext:        Right Lower Extremity               Dressing c/d/i             Swelling stable, no changes             Distal motor and sensory functions intact             Ext warm             + DP pulse             No pain with passive stretching             Compartments full but compressible and no change in pain  with palpation   Assessment and Plan   POD/HD#: 2   57 y/o male s/p work-related R leg injury   1. R leg pain and swelling/ elevated CK/early rhabdomyolysis               improving             dressing change               Continue with aggressive fluid hydration, CK trending down, will dec fluid rate             Strict I&O monitoring             Ice and elevate             PT eval  check ck tomorrow             urine myoglobin pending                          Continue with inpatient monitoring, pt will likely be ready for dc tomorrow                           Pt lives at a shelter [facility])  2. DM   Appreciate IM eval   3. FEN             LR @ 75 cc/hr             CHO modified diet             Strict I&O's  4. DVT/PE prophylaxis             Foot pumps              5. Pain             PO meds             Tylenol             No NSAIDs, trying to avoid additional renal insult  6. Dispo             Continue to monitor             Hydrate, hydrate             Therapies  Dc tomorrow    Pt will need close outpt follow up for DM  Needs PCP in [place]      Mearl Latin, PA-C [facility] [phone] (P) 07/09/2013 9:22 AM

## 2013-07-09 NOTE — Progress Notes (Signed)
Physical Therapy Treatment Patient Details Name: [name] MRN: [id number] DOB: [birth year] Today's Date: 07/09/2013 Time: 4098-1191 PT Time Calculation (min): 23 min  PT Assessment / Plan / Recommendation  History of Present Illness 57 y/o male with work related R leg injury   PT Comments   Pt very pleasant & willing to participate in therapy.  Moving fairly well.  Pt states he's tolerating WBing through RLE better today than yesterday.  Cont'd with use of RW today per pt's request-  Will trial next session if pt wants.     Follow Up Recommendations  Supervision for mobility/OOB;SNF     Does the patient have the potential to tolerate intense rehabilitation     Barriers to Discharge        Equipment Recommendations  Rolling walker with 5" wheels;Other (comment) (or possibly crutches, TBD)    Recommendations for Other Services    Frequency Min 4X/week   Progress towards PT Goals Progress towards PT goals: Progressing toward goals  Plan Current plan remains appropriate    Precautions / Restrictions Precautions Precautions: Fall Restrictions Weight Bearing Restrictions: No Other Position/Activity Restrictions: WBAT R LE   Pertinent Vitals/Pain 4-5/10 RLE with ambulation     Mobility  Bed Mobility Bed Mobility: Supine to Sit;Sitting - Scoot to Edge of Bed Supine to Sit: 6: Modified independent (Device/Increase time) Sitting - Scoot to Edge of Bed: 6: Modified independent (Device/Increase time) Transfers Transfers: Sit to Stand;Stand to Sit Sit to Stand: 4: Min guard;With upper extremity assist;From bed Stand to Sit: 4: Min guard;With upper extremity assist;With armrests;To chair/3-in-1 Details for Transfer Assistance: cues for hand placement & body positioning before sitting Ambulation/Gait Ambulation/Gait Assistance: 4: Min guard Ambulation Distance (Feet): 80 Feet Assistive device: Rolling walker Ambulation/Gait Assistance Details: Mild unsteadiness noted but  did not require any physical (A).  Cues for safe RW use, encouragement for RLE WBing due to pt initially using NWBing, cues for hip/knee flexion during swing phase.   Gait Pattern: Step-to pattern;Decreased weight shift to right;Antalgic Gait velocity: decreased Stairs: No Wheelchair Mobility Wheelchair Mobility: No      PT Goals (current goals can now be found in the care plan section) Acute Rehab PT Goals PT Goal Formulation: With patient Time For Goal Achievement: 07/15/13 Potential to Achieve Goals: Good  Visit Information  Last PT Received On: 07/09/13 Assistance Needed: +1 History of Present Illness: 57 y/o male with work related R leg injury    Subjective Data      Cognition  Cognition Arousal/Alertness: Awake/alert Behavior During Therapy: WFL for tasks assessed/performed Overall Cognitive Status: Within Functional Limits for tasks assessed    Balance     End of Session PT - End of Session Equipment Utilized During Treatment: Gait belt Activity Tolerance: Patient tolerated treatment well Patient left: in chair;with call bell/phone within reach;with family/visitor present Nurse Communication: Mobility status   GP     Lara Mulch 07/09/2013, 1:21 PM  Verdell Face, PTA [phone] 07/09/2013

## 2013-07-09 NOTE — Progress Notes (Signed)
CSW Proofreader) spoke with pt and pt sister to find out about updates on Worker's Comp coverage for [employer] at [employer]. CSW was informed that pt lawyer is currently working on claim and should have a claim number soon. Pt sister explained in depth that pt lawyer informed them that as long as hospital staff is documenting pt's needs and there is clear documentation showing that pt's current issues are related to the work injury (if appropriate) pt should be able to receive anything necessary. CSW confirmed understanding and informed both pt and pt sister that hospital staff is documenting appropriately as required by policies. CSW to follow up with pt tomorrow to see if a case manager has been assigned to handle Worker's Comp.  Sanjit Mcmichael, LCSWA [phone]

## 2013-07-10 DIAGNOSIS — M6282 Rhabdomyolysis: Secondary | ICD-10-CM

## 2013-07-10 LAB — GLUCOSE, CAPILLARY
Glucose-Capillary: 124 mg/dL — ABNORMAL HIGH (ref 70–99)
Glucose-Capillary: 230 mg/dL — ABNORMAL HIGH (ref 70–99)

## 2013-07-10 LAB — CK: Total CK: 425 U/L — ABNORMAL HIGH (ref 7–232)

## 2013-07-10 LAB — MYOGLOBIN, URINE: Myoglobin, Ur: <27 mcg/L (ref <28)

## 2013-07-10 MED ORDER — ASPIRIN 325 MG PO TABS
325.0000 mg | ORAL_TABLET | Freq: Two times a day (BID) | ORAL | Status: DC
  Administered 2013-07-10 – 2013-07-11 (×3): 325 mg via ORAL
  Filled 2013-07-10 (×4): qty 1

## 2013-07-10 MED ORDER — INSULIN ASPART PROT & ASPART (70-30 MIX) 100 UNIT/ML ~~LOC~~ SUSP
25.0000 [IU] | Freq: Every day | SUBCUTANEOUS | Status: DC
  Administered 2013-07-11: 25 [IU] via SUBCUTANEOUS

## 2013-07-10 MED ORDER — METFORMIN HCL 1000 MG PO TABS: 1000.0000 mg | ORAL_TABLET | Freq: Two times a day (BID) | ORAL | Status: DC

## 2013-07-10 MED ORDER — INSULIN NPH ISOPHANE & REGULAR (70-30) 100 UNIT/ML ~~LOC~~ SUSP: 20.0000 [IU] | Freq: Two times a day (BID) | SUBCUTANEOUS | Status: DC

## 2013-07-10 MED ORDER — INSULIN NPH ISOPHANE & REGULAR (70-30) 100 UNIT/ML ~~LOC~~ SUSP: 25.0000 [IU] | Freq: Two times a day (BID) | SUBCUTANEOUS | Status: DC

## 2013-07-10 MED ORDER — INSULIN ASPART PROT & ASPART (70-30 MIX) 100 UNIT/ML ~~LOC~~ SUSP
25.0000 [IU] | Freq: Every day | SUBCUTANEOUS | Status: DC
  Administered 2013-07-10: 25 [IU] via SUBCUTANEOUS

## 2013-07-10 MED ORDER — INSULIN ASPART PROT & ASPART (70-30 MIX) 100 UNIT/ML ~~LOC~~ SUSP: 20.0000 [IU] | Freq: Every day | SUBCUTANEOUS | Status: DC

## 2013-07-10 MED ORDER — INSULIN ASPART PROT & ASPART (70-30 MIX) 100 UNIT/ML ~~LOC~~ SUSP
30.0000 [IU] | Freq: Every day | SUBCUTANEOUS | Status: DC
  Administered 2013-07-10: 30 [IU] via SUBCUTANEOUS
  Filled 2013-07-10: qty 10

## 2013-07-10 MED ORDER — AMOXICILLIN-POT CLAVULANATE 875-125 MG PO TABS
1.0000 | ORAL_TABLET | Freq: Two times a day (BID) | ORAL | Status: DC
  Administered 2013-07-10 – 2013-07-11 (×3): 1 via ORAL
  Filled 2013-07-10 (×4): qty 1

## 2013-07-10 NOTE — Progress Notes (Signed)
[facility] PROGRESS NOTE  [name] ZOX:[id number] DOB: [birth year] DOA: 07/07/2013 PCP: No PCP Per Patient  Assessment/Plan  Right leg crush injury with mild rhabdomyolysis, resolving.  Management per Dr. Carola Frost   T2DM, A1c 14.9.   -  Restart metformin at discharge 1000mg  twice daily -  Discharge on insulin 70/30  25 units before breakfast and 20 units before dinner. -  Given Rx for glucometer and supplies, insulin with syringes -  Needs primary care follow up in one week  Elevated blood pressure, may be secondary to pain or represent previously unidentified HTN.  Generally wnl over last 24 hours.  Recommend outpatient follow up with PCP in 1-2 weeks to recheck BP.    Tobacco abuse: counselled cessation  Polysubstance abuse, in remission: clean for 2 months  Stable for discharge today.    Diet:  diabetic Access:  PIV IVF:  LR Proph:  Foot pump   HPI/Subjective:  Feeling well.  Leg is improving.  Underwent diabetic teaching yesterday and receiving information about diabetic diet.      Objective: Filed Vitals:   07/09/13 1300 07/09/13 2146 07/10/13 0519 07/10/13 1231  BP: 134/78 125/68 125/68 154/87  Pulse: 70 63 65 96  Temp: 98.3 F (36.8 C) 98 F (36.7 C) 98.4 F (36.9 C) 98.4 F (36.9 C)  TempSrc:  Oral Oral   Resp: 18 20 20 20   Height:      Weight:      SpO2: 100% 100% 100% 100%    Intake/Output Summary (Last 24 hours) at 07/10/13 1233 Last data filed at 07/10/13 1232  Gross per 24 hour  Intake    600 ml  Output   1725 ml  Net  -1125 ml   Filed Weights   07/07/13 1148 07/07/13 1306  Weight: 107.956 kg (238 lb) 109.8 kg (242 lb 1 oz)    Exam:   General:  AAM No acute distress  HEENT:  NCAT, MMM  Cardiovascular:  RRR, nl S1, S2 no mrg, 2+ pulses, warm extremities  Respiratory:  Dminished bilateral BS without focal rales, wheezes, or rhonchi, no increased WOB  Abdomen:   NABS, soft, NT/ND  MSK:   Normal tone and bulk, trace  bilateral LEE.  Right leg wrapped in ACE bandage  Neuro:  Grossly intact  Data Reviewed: Basic Metabolic Panel:  Recent Labs Lab 07/08/13 0845 07/09/13 0408  NA 134* 139  K 3.7 3.5  CL 97 101  CO2 27 29  GLUCOSE 283* 223*  BUN 8 7  CREATININE 0.93 0.97  CALCIUM 9.4 9.6  PHOS 2.8 3.9   Liver Function Tests:  Recent Labs Lab 07/08/13 0845 07/09/13 0408  ALBUMIN 3.6 3.1*   No results found for this basename: LIPASE, AMYLASE,  in the last 168 hours No results found for this basename: AMMONIA,  in the last 168 hours CBC:  Recent Labs Lab 07/07/13 1152  WBC 8.0  NEUTROABS 3.9  HGB 14.6  HCT 41.7  MCV 79.0  PLT 206   Cardiac Enzymes:  Recent Labs Lab 07/07/13 1152 07/08/13 0527 07/08/13 1440 07/09/13 0408 07/10/13 0513  CKTOTAL 333* 1156* 923* 649* 425*   BNP (last 3 results) No results found for this basename: PROBNP,  in the last 8760 hours CBG:  Recent Labs Lab 07/09/13 1101 07/09/13 1544 07/09/13 2145 07/10/13 0605 07/10/13 1112  GLUCAP 254* 222* 160* 181* 124*    No results found for this or any previous visit (from the past 240 hour(s)).  Studies: No results found.  Scheduled Meds: . amoxicillin-clavulanate  1 tablet Oral Q12H  . aspirin  325 mg Oral BID  . insulin aspart protamine- aspart  25 Units Subcutaneous Q supper  . insulin aspart protamine- aspart  30 Units Subcutaneous Q breakfast   Continuous Infusions: . lactated ringers 75 mL/hr at 07/09/13 2349    Principal Problem:   Right leg injury Active Problems:   DM (diabetes mellitus), type 2, uncontrolled   Tobacco abuse   Polysubstance abuse, in remission   Elevated blood pressure   Rhabdomyolysis    Time spent: 30 min    Emilyn Ruble, [facility]  [facility] Pager [phone]. If 7PM-7AM, please contact night-coverage at [email or web address], password [facility] 07/10/2013, 12:33 PM  LOS: 3 days

## 2013-07-10 NOTE — Progress Notes (Signed)
Physical Therapy Treatment Patient Details Name: [name] MRN: [id number] DOB: [birth year] Today's Date: 07/10/2013 Time: 7829-5621 PT Time Calculation (min): 30 min  PT Assessment / Plan / Recommendation  History of Present Illness 57 y/o male with work related R leg injury   PT Comments   Pt making steady progress with mobility.  Used crutches for ambulation today.  Discussed d/c plans with SW, orthopaedic trauma PA, & pt/pt's sister who state plans are for pt to return back to [facility] at d/c.  Pt will need crutches at d/c.     Follow Up Recommendations  Supervision for mobility/OOB;SNF (returning back to [facility])     Does the patient have the potential to tolerate intense rehabilitation     Barriers to Discharge        Equipment Recommendations  Crutches    Recommendations for Other Services    Frequency Min 4X/week   Progress towards PT Goals Progress towards PT goals: Progressing toward goals  Plan Current plan remains appropriate    Precautions / Restrictions Precautions Precautions: Fall Restrictions Weight Bearing Restrictions: No Other Position/Activity Restrictions: WBAT R LE   Pertinent Vitals/Pain "it's fine"     Mobility  Bed Mobility Bed Mobility: Supine to Sit;Sitting - Scoot to Edge of Bed Supine to Sit: 6: Modified independent (Device/Increase time) Sitting - Scoot to Edge of Bed: 6: Modified independent (Device/Increase time) Details for Bed Mobility Assistance: Pt sitting EOB at end of session Transfers Transfers: Sit to Stand;Stand to Sit Sit to Stand: 4: Min guard;With upper extremity assist;From bed Stand to Sit: 4: Min guard;With upper extremity assist;To bed Details for Transfer Assistance: cues for safe crutch use Ambulation/Gait Ambulation/Gait Assistance: 4: Min guard Ambulation Distance (Feet): 200 Feet Assistive device: Crutches Ambulation/Gait Assistance Details: cues for sequencing & safe use of crutches.  As  distance increased pt appeared to increase WBing RLE & steadiness improved.   Gait Pattern: Step-to pattern;Decreased weight shift to right Stairs: No Wheelchair Mobility Wheelchair Mobility: No      PT Goals (current goals can now be found in the care plan section) Acute Rehab PT Goals PT Goal Formulation: With patient Time For Goal Achievement: 07/15/13 Potential to Achieve Goals: Good  Visit Information  Last PT Received On: 07/10/13 Assistance Needed: +1 History of Present Illness: 57 y/o male with work related R leg injury    Subjective Data      Cognition  Cognition Arousal/Alertness: Awake/alert Behavior During Therapy: WFL for tasks assessed/performed Overall Cognitive Status: Within Functional Limits for tasks assessed    Balance     End of Session PT - End of Session Equipment Utilized During Treatment: Gait belt Activity Tolerance: Patient tolerated treatment well Patient left: in bed;with family/visitor present Nurse Communication: Mobility status   GP     Lara Mulch 07/10/2013, 2:37 PM   Verdell Face, PTA [phone] 07/10/2013

## 2013-07-10 NOTE — Progress Notes (Signed)
Orthopaedic Trauma Service Progress Note  Subjective  Doing well No new issues as it pertains to his leg  Case management has set pt [facility] They are also in process of getting him into the match program for his Rx's  SW still working on dc dispo.    Pt worked well with PT yesterday, ambulated with minimal guard using walker Ambulated with walker to bathroom this morning w/o assistance   Review of Systems  Constitutional: Negative for fever and chills.  Eyes: Negative for blurred vision.  Respiratory: Negative for shortness of breath and wheezing.   Cardiovascular: Negative for chest pain and palpitations.  Gastrointestinal: Negative for nausea, vomiting and abdominal pain.  Neurological: Negative for tingling and headaches.     Objective   BP 125/68  Pulse 65  Temp(Src) 98.4 F (36.9 C) (Oral)  Resp 20  Ht 6\' 2"  (1.88 m)  Wt 109.8 kg (242 lb 1 oz)  BMI 31.07 kg/m2  SpO2 100%  Intake/Output     11/05 0701 - 11/06 0700 11/06 0701 - 11/07 0700   P.O.  240   I.V. (mL/kg)     Total Intake(mL/kg)  240 (2.2)   Urine (mL/kg/hr) 1975 (0.7) 400 (0.9)   Total Output 1975 400   Net -1975 -160          Labs  Results for LEODIS, ALCOCER (MRN [id number]) as of 07/10/2013 11:16  Ref. Range 07/08/2013 05:27 07/08/2013 08:45 07/08/2013 14:40 07/09/2013 04:08 07/10/2013 05:13  CK Total Latest Range: 7-232 U/L 1156 (H)  923 (H) 649 (H) 425 (H)   CBG (last 3)   Recent Labs  07/09/13 1544 07/09/13 2145 07/10/13 0605  GLUCAP 222* 160* 181*     Exam  Gen: Awake and alert, NAD, resting comfortably  Lungs: breathing unlabored Cardiac: Regular WUJ:WJXB, NTND Ext:       Right Lower Extremity   Swelling decreasing  Abrasions stable and redressed   Ext warm  + DP pulse  Distal motor and sensory functions intact  Compartments softer  No pain with passive stretching  No palpable cords    Assessment and Plan    POD/HD#:  55   57 y/o male s/p work-related R leg injury   1. R leg pain and swelling/ elevated CK/early rhabdomyolysis               improving             dressing changed              CK continuing to decrease, continue with adequate PO intact             Strict I&O monitoring             Ice and elevate             PT eval             check ck tomorrow                                      pt is ready for dc once bed available                          Pt lives at a shelter [facility]) but sounds as if he may have used up all of  his days   2. DM              Appreciate IM eval    While his diabetes affects his overall clinical picture, it is not related to his work related accident.    His Hgb A1C of >14 is indicative of poor sugar control for at least 3 months but with a level like this it is likely that his poor sugar control has been going on much longer    It is imperative that pt keeps his f/u appointments to monitor and tx his DM.  Pt will also need vision eval as out pt as well   3. FEN             LR @ 75 cc/hr             CHO modified diet             Strict I&O's  4. DVT/PE prophylaxis             Foot pumps  TED               5. Pain             PO meds             Tylenol             No NSAIDs, trying to avoid additional renal insult  6. Dispo             Continue to monitor             Hydrate, hydrate             Therapies             Dc today or tomorrow                           Pt will need close outpt follow up for DM             Needs PCP in [place]     Mearl Latin, PA-C [facility] [phone] (P) 07/10/2013 11:12 AM

## 2013-07-10 NOTE — Progress Notes (Signed)
Orthopedic Tech Progress Note Patient Details:  [name] Inga Apr 04, 1956 [id number]  Ortho Devices Type of Ortho Device: Crutches Ortho Device/Splint Location: Right LE Ortho Device/Splint Interventions: Application   Cammer, Mickie Bail 07/10/2013, 12:39 PM

## 2013-07-10 NOTE — Progress Notes (Signed)
CSW Proofreader) spoke with pt and pt sister. Pt will be going home with family in [place]. at time of discharge. Pt brother-in-law will be providing transportation for pt when pt is ready for dc. Pt and pt sister inquiring about [facility] services. CSW has spoken with CM about this. At this time, there are no further social work needs. CSW signing off.   Latima Hamza, LCSWA [phone]

## 2013-07-10 NOTE — Progress Notes (Signed)
CSW Proofreader) spoke with pt and pt sister about discharge plan. CSW explained that pt is currently not in need of therapy at SNF and LOG will not be an option. Pt and pt wife seemed to be understanding of this. CSW spoke with pt and pt sister about options for discharge. At this time, no family members are able have pt come and stay with them. Pt has agreed to return to [facility] and informed CSW he has about 2 weeks left there. CSW called [facility] to confirm that pt can return today. CSW informed pt sister, as pt was out of room, pt nurse and PA. Will continue to follow to ensure pt has transportation to [facility].  Jalexus Brett, LCSWA [phone]

## 2013-07-10 NOTE — Progress Notes (Signed)
   CARE MANAGEMENT NOTE 07/10/2013  Patient:  ELIJAHJAMES, FUELLING   Account Number:  [id number]  Date Initiated:  07/09/2013  Documentation initiated by:  [facility]  Subjective/Objective Assessment:   admitted with rt leg crush injury     Action/Plan:   PT/OT evals-recommended SNF   Anticipated DC Date:  07/10/2013   Anticipated DC Plan:  SKILLED NURSING FACILITY  In-house referral  Clinical Social Worker      DC Planning Services  CM consult  [facility]      Choice offered to / List presented to:             Status of service:  In process, will continue to follow Medicare Important Message given?   (If response is "NO", the following Medicare IM given date fields will be blank) Date Medicare IM given:   Date Additional Medicare IM given:    Discharge Disposition:    Per UR Regulation:    If discussed at Long Length of Stay Meetings, dates discussed:    Comments:  07/10/2013 1430 NCM spoke to pt and states he will be going home with his sister after he is discharged from hospital. States he has filed a Freight forwarder and has a Clinical research associate that is handling his case. He has crutches in the room. Pt states RW is not needed. States his Worker's Education officer, environmental should be finished by the 11/10 and will wait to arrange HH at that time. NCM does not have info to arrange [facility].  Does not want to pay for services out of pocket. Explained once he receives a Case Worker they will arrange his therapy. Will follow up with pt on 11/7. Isidoro Donning RN CCM Case Mgmt [facility] [phone]  07/09/13 Spoke with patient and his sister about getting a PCP. He is agreeable to appt at [facility]. Made appt for 07/29/13 at 3:45. Gave patient appt information and a discount pharmacy card.Plan is for d/c to SNF. CSW working with patient. Will continue to follow. Jacquelynn Cree RN, BSN, CCM

## 2013-07-11 DIAGNOSIS — S8780XA Crushing injury of unspecified lower leg, initial encounter: Secondary | ICD-10-CM

## 2013-07-11 LAB — GLUCOSE, CAPILLARY: Glucose-Capillary: 90 mg/dL (ref 70–99)

## 2013-07-11 LAB — RENAL FUNCTION PANEL
Albumin: 3 g/dL — ABNORMAL LOW (ref 3.5–5.2)
BUN: 7 mg/dL (ref 6–23)
CO2: 26 mEq/L (ref 19–32)
Chloride: 105 mEq/L (ref 96–112)
Glucose, Bld: 191 mg/dL — ABNORMAL HIGH (ref 70–99)
Potassium: 3.8 mEq/L (ref 3.5–5.1)
Sodium: 139 mEq/L (ref 135–145)

## 2013-07-11 LAB — CK: Total CK: 366 U/L — ABNORMAL HIGH (ref 7–232)

## 2013-07-11 MED ORDER — AMOXICILLIN-POT CLAVULANATE 875-125 MG PO TABS: 1.0000 | ORAL_TABLET | Freq: Two times a day (BID) | ORAL | Status: DC

## 2013-07-11 MED ORDER — ASPIRIN 325 MG PO TABS: 325.0000 mg | ORAL_TABLET | Freq: Two times a day (BID) | ORAL | Status: DC

## 2013-07-11 MED ORDER — OXYCODONE-ACETAMINOPHEN 5-325 MG PO TABS: 1.0000 | ORAL_TABLET | Freq: Three times a day (TID) | ORAL | Status: DC | PRN

## 2013-07-11 MED ORDER — ACETAMINOPHEN 325 MG PO TABS: 650.0000 mg | ORAL_TABLET | Freq: Four times a day (QID) | ORAL | Status: DC | PRN

## 2013-07-11 NOTE — Progress Notes (Signed)
I have seen and examined the patient. I agree with the findings above.  R leg crush Significant swelling but intact sens and motor NO pain with passive stretch  Will admit and observe for compartment syndrome and rhabdomyolysis  Budd Palmer, MD

## 2013-07-11 NOTE — Progress Notes (Signed)
Orthopaedic Trauma Service Progress Note  Subjective  Doing better Ready for DC  No new issues  Pain controlled   Review of Systems  Constitutional: Negative for fever and chills.  Respiratory: Negative for shortness of breath and wheezing.   Cardiovascular: Negative for chest pain and palpitations.  Gastrointestinal: Negative for nausea, vomiting and abdominal pain.  Neurological: Negative for tingling, sensory change and headaches.     Objective   BP 158/84  Pulse 76  Temp(Src) 97.9 F (36.6 C) (Oral)  Resp 18  Ht 6\' 2"  (1.88 m)  Wt 109.8 kg (242 lb 1 oz)  BMI 31.07 kg/m2  SpO2 100%  Intake/Output     11/06 0701 - 11/07 0700 11/07 0701 - 11/08 0700   P.O. 1080    I.V. (mL/kg) 3118.8 (28.4)    Total Intake(mL/kg) 4198.8 (38.2)    Urine (mL/kg/hr) 2550 (1)    Total Output 2550     Net +1648.8            Labs  Results for [name] (MRN [id number]) as of 07/11/2013 08:16  Ref. Range 07/08/2013 14:40 07/09/2013 04:08 07/10/2013 05:13 07/11/2013 04:04  CK Total Latest Range: 7-232 U/L 923 (H) 649 (H) 425 (H) 366 (H)   Results for [name] (MRN [id number]) as of 07/11/2013 08:16  Ref. Range 07/11/2013 04:04  Sodium Latest Range: 135-145 mEq/L 139  Potassium Latest Range: 3.5-5.1 mEq/L 3.8  Chloride Latest Range: 96-112 mEq/L 105  CO2 Latest Range: 19-32 mEq/L 26  BUN Latest Range: 6-23 mg/dL 7  Creatinine Latest Range: 0.50-1.35 mg/dL 5.62  Calcium Latest Range: 8.4-10.5 mg/dL 8.8  GFR calc non Af Amer Latest Range: >90 mL/min >90  GFR calc Af Amer Latest Range: >90 mL/min >90  Glucose Latest Range: 70-99 mg/dL 130 (H)  Phosphorus Latest Range: 2.3-4.6 mg/dL 3.2  Albumin Latest Range: 3.5-5.2 g/dL 3.0 (L)    Exam  Gen: awake and alert, sitting on EOB, NAD, comfortable, talking on cellphone Lungs: clear anterior fields Cardiac: RRR, S1 and S2 Abd: soft, NTND, + BS Ext:       Right Lower Extremity   Dressing c/d/i  Distal motor and sensory  functions intact  Ext warm  + DP pulse  No dct  Compartments softer  Swelling decreasing  Wounds stable  No signs of infection    Assessment and Plan   POD/HD#: 67   57 y/o male s/p work-related R leg injury   1. R leg pain and swelling/ elevated CK/early rhabdomyolysis               improving             dressing changes as needed             CK continuing to decrease, continue with adequate PO intact             TED to R leg                        dc today  Pt to dc home with family   WBAT   No restrictions                                      2. DM              Appreciate IM eval  While his diabetes affects his overall clinical picture, it is not related to his work related accident.                His Hgb A1C of >14 is indicative of poor sugar control for at least 3 months but with a level like this it is likely that his poor sugar control has been going on much longer               It is imperative that pt keeps his f/u appointments to monitor and tx his DM.  Pt will also need vision eval as out pt as well   3. FEN                          CHO modified diet             adequate PO intake  4. DVT/PE prophylaxis  ASA 325 po BID x 4 weeks                       TED                5. Pain             PO meds             Tylenol             No NSAIDs, trying to avoid additional renal insult  6. ID  Have started pt on Augmentin 875 q 12 x 10 days for prophylaxis  Pt with DM is a risk for cellulitis, will cover for 10 days 7. Dispo                          Hydrate, hydrate                          Dc today                          Pt will need close outpt follow up for DM             Needs PCP in [place]     Edward Latin, PA-C [facility] [phone] (P) 07/11/2013 8:15 AM

## 2013-07-11 NOTE — Progress Notes (Signed)
Physical Therapy Treatment Patient Details Name: [name] MRN: [id number] DOB: 08-Mar-1956 Today's Date: 07/11/2013 Time: 4098-1191 PT Time Calculation (min): 12 min  PT Assessment / Plan / Recommendation  History of Present Illness 57 y/o male with work related R leg injury   PT Comments   Pt cont's to make steady progress with mobility.  Per chart review & pt, plans are now for pt to d/c to his sister's house.  Practiced steps today.  Pt safe to d/c home from mobility standpoint.   Follow Up Recommendations        Does the patient have the potential to tolerate intense rehabilitation     Barriers to Discharge        Equipment Recommendations  Crutches    Recommendations for Other Services    Frequency Min 4X/week   Progress towards PT Goals Progress towards PT goals: Progressing toward goals  Plan Current plan remains appropriate    Precautions / Restrictions Precautions Precautions: Fall Restrictions Weight Bearing Restrictions: No Other Position/Activity Restrictions: WBAT R LE       Mobility  Bed Mobility Bed Mobility: Not assessed Transfers Transfers: Sit to Stand;Stand to Sit Sit to Stand: 5: Supervision;With upper extremity assist;From bed Stand to Sit: 5: Supervision;With upper extremity assist;To bed Details for Transfer Assistance: Pt demonstrated safe use of crutches Ambulation/Gait Ambulation/Gait Assistance: 4: Min guard Ambulation Distance (Feet): 120 Feet Assistive device: Crutches Ambulation/Gait Assistance Details: Encouragement to increase RLE WBing.  Pt uses crutches safely Gait Pattern: Step-to pattern Stairs: Yes Stairs Assistance: 4: Min guard Stairs Assistance Details (indicate cue type and reason): cues for sequencing & technique.   Stair Management Technique: One rail Right;Step to pattern;With crutches;Forwards Number of Stairs: 2 Wheelchair Mobility Wheelchair Mobility: No      PT Goals (current goals can now be found in  the care plan section) Acute Rehab PT Goals PT Goal Formulation: With patient Time For Goal Achievement: 07/15/13 Potential to Achieve Goals: Good  Visit Information  Last PT Received On: 07/11/13 Assistance Needed: +1 History of Present Illness: 57 y/o male with work related R leg injury    Subjective Data      Cognition  Cognition Arousal/Alertness: Awake/alert Behavior During Therapy: WFL for tasks assessed/performed Overall Cognitive Status: Within Functional Limits for tasks assessed    Balance     End of Session PT - End of Session Equipment Utilized During Treatment: Gait belt Activity Tolerance: Patient tolerated treatment well Patient left: with call bell/phone within reach (sitting EOB ) Nurse Communication: Mobility status   GP     Lara Mulch 07/11/2013, 11:42 AM   Verdell Face, PTA [phone] 07/11/2013

## 2013-07-11 NOTE — Progress Notes (Signed)
Patient discharged home accompanied by family. Discharge instructions and rx given and explained and patient stated understanding. Patients IV was removed and he left unit in a stable condition via wheelchair.

## 2013-07-11 NOTE — H&P (Signed)
I have seen and examined the patient. I agree with the findings above.  R leg crush Significant swelling but intact sens and motor NO pain with passive stretch  Will admit and observe for compartment syndrome and rhabdomyolysis  Rishi Vicario H, MD   

## 2013-07-11 NOTE — Discharge Summary (Signed)
Orthopaedic Trauma Service (OTS)  Patient ID: [name] MRN: [id number] DOB/AGE: Jan 26, 1956 57 y.o.  Admit date: 07/07/2013 Discharge date: 07/11/2013  Admission Diagnoses: Crush injury R leg Uncontrolled type 2 DM Tobacco dependence rhabdomyolysis  Discharge Diagnoses:  Principal Problem:   Lower leg crush injury Active Problems:   DM (diabetes mellitus), type 2, uncontrolled   Right leg injury   Tobacco abuse   Polysubstance abuse, in remission   Elevated blood pressure   Rhabdomyolysis   Procedures Performed: Admitted for close observation to med-surg floor  Discharged Condition: good  Hospital Course:   Patient is a 57 year old black male who was injured while at work on 07/07/2013. Patient works for a [employer], children[employer]. He was attempting to detached trailer from the cab when he was struck in the right leg by the air line. The patient was brought to [facility] for evaluation. There is some concern for compartment syndrome. Orthopedics was consulted to assess the patient. We felt that it was in the patient's best interest to admit him for observation. Patient was admitted to the orthopedic floor for close observation for possible compartment syndrome. He did not exhibit any signs or symptoms of compartment syndrome in the emergency department. Patient was placed into a soft compressive dressing with aggressive ice and elevation of his extremity to the level of his heart. Patient was also started on to maintain adequate hydration. Initial lab workup demonstrated a CK of 333 on admission, this quickly rose to 1156 on hospital day #1. Given the rise in his CK we felt that it was necessary to keep the patient for further observation as well as to increase his fluids and to ensure  Adequate hydration and perfusion. his IV fluid rate was increased and he was also given 2 doses of Lasix to help clearance of toxic muscle breakdown elements. Also given his history of diabetes  we did check a hemoglobin A1c which was significantly elevated at 14.9. Given this we also obtained a internal medicine consult to help establish a good blood sugar control regimen as well as to help facilitate establishment of care with a local PCP. Over the next several days patient continued to progress well. No acute issues were noted. With continued IV fluids a CK continued to drop steady rate. Patient began working with physical therapy on hospital day #2 and was doing very well with use of walker and crutches. He was requiring minimal assist with PT as well as nursing staff. Patient and his sister did inquire as to appropriateness of a skilled nursing Center however patient did not have any acute issues requiring skilled nursing Center admission. After much discussion patient was able to discharge home with his family as patient is staying in a shelter at the current time.   patient did have some issues with elevated blood pressure during her hospital stay but they are not consistent. This will have to be followed up as an outpatient with his PCP.   hospital day #4 patient was tolerating a carbohydrate modified diet, voiding well and passing gas. His pain was adequately controlled. His urine was clear and patient was deemed to be stable for discharge with his family. Discharge CK is 366. A renal function panel is within normal limits as well. We did maintain close observation of his renal functions to make sure that he did not have any alterations in his kidney functions did presents as long-standing diabetes. We also did start the patient on  Augmentin 875 mg one by mouth every 12 hours for the next 10 days as prophylaxis against cellulitis. It does appear that the patient has some degree of peripheral vascular disease which is not unexpected given that diabetes is a microvascular condition. He does have some abrasions to his right leg and we want to prophylaxis against any potential for cellulitis. At the  time of discharge his wounds were very stable and did not exhibit any signs or symptoms of cellulitis or erysipelas  In terms of DVT and PE prophylaxis patient was covered with foot pumps and TED hose. We did not want to start any pharmacologic agents while we are monitoring him for compartment syndrome as well as while we were monitoring his kidney functions. We wanted to limit the amount of insult to his kidneys. Was also felt that Coumadin was not needed nor was Lovenox. The patient was started on aspirin at the time of discharge once we're certain that his renal perfusion was adequate and once we were certain that patient did not exhibit any additional risks for bleeding into his lower leg compartments  The patient was also rescheduled for an outpatient followup visit with the [facility] for his diabetes as well as to reevaluate his hypertension on 07/29/2013.   While the patient's diabetes does add another layer of complexity to his overall clinical picture his diabetes did not contribute to his work related accident. Although it could potentially complicated his recovery but at this current time it does not appear to be doing so. Patient will require close followup to ensure that his blood sugars are controlled at an appropriate level. He will also need an outpatient for a followup with an ophthalmologist to evaluate his eyes given known association of diabetes and ophthalmologic conditions      Consults: Internal medicine   Significant Diagnostic Studies: labs:   Results for Edward, Alvarez (MRN [id number]) as of 07/11/2013 08:16  Ref. Range 07/07/2013 11:52 07/08/2013 05:27 07/08/2013 08:45 07/08/2013 14:40 07/09/2013 04:08 07/10/2013 05:13 07/11/2013 04:04  CK Total Latest Range: 7-232 U/L 333 (H) 1156 (H)  923 (H) 649 (H) 425 (H) 366 (H)  Results for Edward, Alvarez (MRN [id number]) as of 07/11/2013 08:16  Ref. Range 07/11/2013 04:04  Sodium Latest Range: 135-145 mEq/L 139   Potassium Latest Range: 3.5-5.1 mEq/L 3.8  Chloride Latest Range: 96-112 mEq/L 105  CO2 Latest Range: 19-32 mEq/L 26  BUN Latest Range: 6-23 mg/dL 7  Creatinine Latest Range: 0.50-1.35 mg/dL 9.52  Calcium Latest Range: 8.4-10.5 mg/dL 8.8  GFR calc non Af Amer Latest Range: >90 mL/min >90  GFR calc Af Amer Latest Range: >90 mL/min >90  Glucose Latest Range: 70-99 mg/dL 841 (H)  Phosphorus Latest Range: 2.3-4.6 mg/dL 3.2  Albumin Latest Range: 3.5-5.2 g/dL 3.0 (L)   Results for NESANEL, AGUILA (MRN [id number]) as of 07/11/2013 08:16  Ref. Range 07/07/2013 11:52  Hemoglobin A1C Latest Range: <5.7 % 14.9 (H)   CBG (last 3)   Recent Labs  07/10/13 1614 07/10/13 2157 07/11/13 0644  GLUCAP 130* 230* 173*     Treatments: IV hydration, antibiotics: augmentin, analgesia: Dilaudid and percocet, oxycodone, anticoagulation: TEDs, foot pumps, ASA started at dc and therapies: PT, RN and SW  Discharge Exam:         Orthopaedic Trauma Service Progress Note  Subjective  Doing better Ready for DC   No new issues   Pain controlled   Review of Systems  Constitutional: Negative for fever  and chills.  Respiratory: Negative for shortness of breath and wheezing.   Cardiovascular: Negative for chest pain and palpitations.  Gastrointestinal: Negative for nausea, vomiting and abdominal pain.  Neurological: Negative for tingling, sensory change and headaches.     Objective   BP 158/84  Pulse 76  Temp(Src) 97.9 F (36.6 C) (Oral)  Resp 18  Ht 6\' 2"  (1.88 m)  Wt 109.8 kg (242 lb 1 oz)  BMI 31.07 kg/m2  SpO2 100%  Intake/Output     11/06 0701 - 11/07 0700 11/07 0701 - 11/08 0700    P.O. 1080     I.V. (mL/kg) 3118.8 (28.4)     Total Intake(mL/kg) 4198.8 (38.2)     Urine (mL/kg/hr) 2550 (1)     Total Output 2550      Net +1648.8              Labs  Results for TREMAIN, RUCINSKI (MRN [id number]) as of 07/11/2013 08:16   Ref. Range  07/08/2013 14:40  07/09/2013 04:08  07/10/2013  05:13  07/11/2013 04:04   CK Total  Latest Range: 7-232 U/L  923 (H)  649 (H)  425 (H)  366 (H)    Results for MONTARIUS, KITAGAWA (MRN [id number]) as of 07/11/2013 08:16   Ref. Range  07/11/2013 04:04   Sodium  Latest Range: 135-145 mEq/L  139   Potassium  Latest Range: 3.5-5.1 mEq/L  3.8   Chloride  Latest Range: 96-112 mEq/L  105   CO2  Latest Range: 19-32 mEq/L  26   BUN  Latest Range: 6-23 mg/dL  7   Creatinine  Latest Range: 0.50-1.35 mg/dL  6.96   Calcium  Latest Range: 8.4-10.5 mg/dL  8.8   GFR calc non Af Amer  Latest Range: >90 mL/min  >90   GFR calc Af Amer  Latest Range: >90 mL/min  >90   Glucose  Latest Range: 70-99 mg/dL  295 (H)   Phosphorus  Latest Range: 2.3-4.6 mg/dL  3.2   Albumin  Latest Range: 3.5-5.2 g/dL  3.0 (L)     Exam  Gen: awake and alert, sitting on EOB, NAD, comfortable, talking on cellphone Lungs: clear anterior fields Cardiac: RRR, S1 and S2 Abd: soft, NTND, + BS Ext:        Right Lower Extremity               Dressing c/d/i             Distal motor and sensory functions intact             Ext warm             + DP pulse             No dct             Compartments softer             Swelling decreasing             Wounds stable             No signs of infection    Assessment and Plan   POD/HD#: 69   57 y/o male s/p work-related R leg injury   1. R leg pain and swelling/ elevated CK/early rhabdomyolysis               improving             dressing changes as needed  CK continuing to decrease, continue with adequate PO intact             TED to R leg                        dc today             Pt to dc home with family               WBAT               No restrictions                                      2. DM              Appreciate IM eval               While his diabetes affects his overall clinical picture, it is not related to his work related accident.                His Hgb A1C of >14 is indicative of poor sugar  control for at least 3 months but with a level like this it is likely that his poor sugar control has been going on much longer               It is imperative that pt keeps his f/u appointments to monitor and tx his DM.  Pt will also need vision eval as out pt as well   3. FEN                          CHO modified diet             adequate PO intake  4. DVT/PE prophylaxis             ASA 325 po BID x 4 weeks                       TED                5. Pain             PO meds             Tylenol             No NSAIDs, trying to avoid additional renal insult  6. ID             Have started pt on Augmentin 875 q 12 x 10 days for prophylaxis             Pt with DM is a risk for cellulitis, will cover for 10 days 7. Dispo                          Hydrate, hydrate                          Dc today                          Pt will need close outpt follow up for DM             Needs PCP in [place]     [place] [address]  [facility] [phone] (P) 07/11/2013 8:15 AM      Disposition: 01-Home or Self Care  Discharge Orders   Future Appointments Provider Department Dept Phone   07/29/2013 3:45 PM [facility] Covering Provider [facility] [phone]   Future Orders Complete By Expires   Call MD / Call 911  As directed    Comments:     If you experience chest pain or shortness of breath, CALL 911 and be transported to the hospital emergency room.  If you develope a fever above 101 F, pus (white drainage) or increased drainage or redness at the wound, or calf pain, call your surgeon's office.   Constipation Prevention  As directed    Comments:     Drink plenty of fluids.  Prune juice may be helpful.  You may use a stool softener, such as Colace (over the counter) 100 mg twice a day.  Use MiraLax (over the counter) for constipation as needed.   Diet Carb Modified  As directed    Discharge instructions  As directed    Comments:      Orthopaedic Trauma Service Discharge Instructions   General Discharge Instructions  WEIGHT BEARING STATUS: Weightbearing as tolerated Right Leg  RANGE OF MOTION/ACTIVITY: Activity as tolerated, use crutches as needed. Can take off TED hose at night   Diet: Carbohydrate modified diet.  Can use over the counter stool softeners and bowel preparations, such as Miralax, to help with bowel movements.  Narcotics can be constipating.  Be sure to drink plenty of fluids  STOP SMOKING OR USING NICOTINE PRODUCTS!!!!  As discussed nicotine severely impairs your body's ability to heal surgical and traumatic wounds but also impairs bone healing.  Wounds and bone heal by forming microscopic blood vessels (angiogenesis) and nicotine is a vasoconstrictor (essentially, shrinks blood vessels).  Therefore, if vasoconstriction occurs to these microscopic blood vessels they essentially disappear and are unable to deliver necessary nutrients to the healing tissue.  This is one modifiable factor that you can do to dramatically increase your chances of healing your injury.    (This means no smoking, no nicotine gum, patches, etc)  PAIN MEDICATION USE AND EXPECTATIONS  You have likely been given narcotic medications to help control your pain.  After a traumatic event that results in an fracture (broken bone) with or without surgery, it is ok to use narcotic pain medications to help control one's pain.  We understand that everyone responds to pain differently and each individual patient will be evaluated on a regular basis for the continued need for narcotic medications. Ideally, narcotic medication use should last no more than 6-8 weeks (coinciding with fracture healing).   As a patient it is your responsibility as well to monitor narcotic medication use and report the amount and frequency you use these medications when you come to your office visit.   We would also advise that if you are using narcotic medications, you  should take a dose prior to therapy to maximize you participation.  IF YOU ARE ON NARCOTIC MEDICATIONS IT IS NOT PERMISSIBLE TO OPERATE A MOTOR VEHICLE (MOTORCYCLE/CAR/TRUCK/MOPED) OR HEAVY MACHINERY DO NOT MIX NARCOTICS WITH OTHER CNS (CENTRAL NERVOUS SYSTEM) DEPRESSANTS SUCH AS ALCOHOL       ICE AND ELEVATE INJURED/OPERATIVE EXTREMITY  Using ice and elevating the injured extremity above your heart can help with swelling and pain control.  Icing in a pulsatile fashion, such as 20 minutes on and 20 minutes off, can  be followed.    Do not place ice directly on skin. Make sure there is a barrier between to skin and the ice pack.    Using frozen items such as frozen peas works well as the conform nicely to the are that needs to be iced.  USE AN ACE WRAP OR TED HOSE FOR SWELLING CONTROL  In addition to icing and elevation, Ace wraps or TED hose are used to help limit and resolve swelling.  It is recommended to use Ace wraps or TED hose until you are informed to stop.    When using Ace Wraps start the wrapping distally (farthest away from the body) and wrap proximally (closer to the body)   Example: If you had surgery on your leg or thing and you do not have a splint on, start the ace wrap at the toes and work your way up to the thigh        If you had surgery on your upper extremity and do not have a splint on, start the ace wrap at your fingers and work your way up to the upper arm  Discharge Wound Care Instructions  Do NOT apply any ointments, solutions or lotions to pin sites or surgical wounds.  These prevent needed drainage and even though solutions like hydrogen peroxide kill bacteria, they also damage cells lining the pin sites that help fight infection.  Applying lotions or ointments can keep the wounds moist and can cause them to breakdown and open up as well. This can increase the risk for infection. When in doubt call the office.  Surgical incisions should be dressed daily.  If any  drainage is noted, use one layer of adaptic, then gauze, Kerlix, and an ace wrap.  Once the incision is completely dry and without drainage, it may be left open to air out.  Showering may begin 36-48 hours later.  Cleaning gently with soap and water.  Traumatic wounds should be dressed daily as well.    One layer of adaptic, gauze, Kerlix, then ace wrap.  The adaptic can be discontinued once the draining has ceased    If you have a wet to dry dressing: wet the gauze with saline the squeeze as much saline out so the gauze is moist (not soaking wet), place moistened gauze over wound, then place a dry gauze over the moist one, followed by Kerlix wrap, then ace wrap.   Increase activity slowly as tolerated  As directed        Medication List    STOP taking these medications       clotrimazole 1 % cream  Commonly known as:  LOTRIMIN     insulin regular 100 units/mL injection  Commonly known as:  NOVOLIN R,HUMULIN R      TAKE these medications       acetaminophen 325 MG tablet  Commonly known as:  TYLENOL  Take 2 tablets (650 mg total) by mouth every 6 (six) hours as needed for mild pain (or Fever >/= 101).     amoxicillin-clavulanate 875-125 MG per tablet  Commonly known as:  AUGMENTIN  Take 1 tablet by mouth every 12 (twelve) hours.     aspirin 325 MG tablet  Take 1 tablet (325 mg total) by mouth 2 (two) times daily.     insulin NPH-regular (70-30) 100 UNIT/ML injection  Commonly known as:  NOVOLIN 70/30  Inject 20-25 Units into the skin 2 (two) times daily with a meal. Inject 25 units before  breakfast and 20 units before dinner.  Make sure to eat a meal after eat injection.     metFORMIN 1000 MG tablet  Commonly known as:  GLUCOPHAGE  Take 1 tablet (1,000 mg total) by mouth 2 (two) times daily with a meal.     oxyCODONE-acetaminophen 5-325 MG per tablet  Commonly known as:  PERCOCET/ROXICET  Take 1-2 tablets by mouth every 8 (eight) hours as needed for severe pain.            Follow-up Information   Follow up with [facility]    . (appt Tuesday 07/29/13 at 3:45pm,arrive at 3:30pm)    Contact information:   [address] [place] [address] [phone]      Follow up with Budd Palmer, MD. Schedule an appointment as soon as possible for a visit in 3 weeks. (call for appointment time )    Specialty:  Orthopedic Surgery   Contact information:   [address] [place] [address] [phone]       Discharge Instructions and Plan:  Patient has sustained a pretty significant musculoskeletal injury to his right leg. However I would anticipate full recovery without any disability or long-term sequela related to his right leg injury. What is more pressing is his long-standing poorly controlled diabetes. This will need to be addressed and followed very closely to prevent or curtail any further decline in his overall health. Patient understands the importance of maintaining tight sugar control off from a general health perspective. As noted above patient will need to keep his followup appointment with his primary care physician that will be established at the [facility]. He will need to have his blood pressure checked as well as monitoring of his blood sugars. Also as important is for him to followup with a ophthalmologist or have a detailed ophthalmologic exam  given the association of diabetes and ophthalmologic conditions. Patient to followup with orthopedics in 3-4 weeks. I would anticipate only one followup visit with [place] just to ensure that he is progressing well. She any questions using her to contact the office.   Signed:  Mearl Latin, PA-C [facility] [phone] (P) 07/11/2013, 8:36 AM

## 2013-07-11 NOTE — Progress Notes (Signed)
Patient seen and examined.  Continue insulin doses as previously prescribed.  Rx for glucometer and supplies provided.  Okay for discharge today.

## 2013-07-11 NOTE — Progress Notes (Signed)
I have seen and examined the patient. I agree with the findings above.  Li Bobo H, MD  

## 2013-07-11 NOTE — Progress Notes (Signed)
I have seen and examined the patient. I agree with the findings above.  Retaj Hilbun H, MD  

## 2013-07-29 ENCOUNTER — Inpatient Hospital Stay: Payer: Self-pay

## 2014-03-17 LAB — HM DIABETES EYE EXAM

## 2015-09-30 ENCOUNTER — Telehealth: Payer: Self-pay | Admitting: Family Medicine

## 2015-09-30 ENCOUNTER — Ambulatory Visit (INDEPENDENT_AMBULATORY_CARE_PROVIDER_SITE_OTHER): Payer: BLUE CROSS/BLUE SHIELD | Admitting: Family Medicine

## 2015-09-30 ENCOUNTER — Encounter: Payer: Self-pay | Admitting: Family Medicine

## 2015-09-30 VITALS — BP 133/88 | HR 86 | Temp 98.0°F | Ht 74.0 in | Wt 253.6 lb

## 2015-09-30 DIAGNOSIS — E119 Type 2 diabetes mellitus without complications: Secondary | ICD-10-CM | POA: Diagnosis not present

## 2015-09-30 DIAGNOSIS — E785 Hyperlipidemia, unspecified: Secondary | ICD-10-CM | POA: Diagnosis not present

## 2015-09-30 LAB — POCT GLYCOSYLATED HEMOGLOBIN (HGB A1C): Hemoglobin A1C: >14

## 2015-09-30 LAB — GLUCOSE, POCT (MANUAL RESULT ENTRY): POC GLUCOSE: 406 mg/dL — AB (ref 70–99)

## 2015-09-30 MED ORDER — SITAGLIPTIN PHOS-METFORMIN HCL 50-1000 MG PO TABS: 1.0000 | ORAL_TABLET | Freq: Two times a day (BID) | ORAL | Status: DC

## 2015-09-30 NOTE — Progress Notes (Signed)
BP 133/88 mmHg  Pulse 86  Temp(Src) 98 F (36.7 C) (Oral)  Ht _0  (1.88 m)  Wt 253 lb 9.6 oz (115.032 kg)  BMI 32.55 kg/m2   Subjective:    Patient ID: [name], male    DOB: 09-13-1955, 60 y.o.   MRN: [id number]  HPI: [name] is a 60 y.o. male presenting on 09/30/2015 for Diabetes   HPI Type 2 diabetes Patient presents today as a new patient for practice. He got put in as an acute visit because at home he was having very elevated blood sugars have been in the 400-500 range. He has had a little bit of dizziness with it but no abdominal pain nausea or vomiting or chest pain or changes in vision. He has been a known diabetic and has been on insulin 70/30 and metformin previously. He has been out of both of these medications for at least the past 2-3 months. He also just recently got insurance and is transferring his care to [place]. He says it is been at least a year since he seen his other provider.  Relevant past medical, surgical, family and social history reviewed and updated as indicated. Interim medical history since our last visit reviewed. Allergies and medications reviewed and updated.  Review of Systems  Constitutional: Negative for fever and chills.  HENT: Negative for ear discharge and ear pain.   Eyes: Negative for discharge and visual disturbance.  Respiratory: Negative for shortness of breath and wheezing.   Cardiovascular: Negative for chest pain and leg swelling.  Gastrointestinal: Negative for abdominal pain, diarrhea and constipation.  Genitourinary: Negative for difficulty urinating.  Musculoskeletal: Negative for back pain and gait problem.  Skin: Negative for rash.  Neurological: Positive for dizziness. Negative for syncope, weakness, light-headedness, numbness and headaches.  All other systems reviewed and are negative.   Per HPI unless specifically indicated above  Social History   Social History  . Marital Status: Single    Spouse  Name: N/A  . Number of Children: N/A  . Years of Education: N/A   Occupational History  . Not on file.   Social History Main Topics  . Smoking status: Current Every Day Smoker -- 0.50 packs/day for 40 years    Types: Cigarettes  . Smokeless tobacco: Never Used  . Alcohol Use: No  . Drug Use: No     Comment: used cocaine 2 months  . Sexual Activity: Not Currently     Comment: no partner in 1 year   Other Topics Concern  . Not on file   Social History Narrative    Past Surgical History  Procedure Laterality Date  . Appendectomy      Family History  Problem Relation Age of Onset  . Heart disease Mother   . Hypertension Mother   . Diabetes Mother   . Alcohol abuse Father   . Diabetes Brother   . Hypertension Brother   . Stroke Sister   . Hypertension Sister   . Diabetes Sister       Medication List       This list is accurate as of: 09/30/15  1:06 PM.  Always use your most recent med list.               sitaGLIPtin-metformin 50-1000 MG tablet  Commonly known as:  JANUMET  Take 1 tablet by mouth 2 (two) times daily with a meal.           Objective:  BP 133/88 mmHg  Pulse 86  Temp(Src) 98 F (36.7 C) (Oral)  Ht _0  (1.88 m)  Wt 253 lb 9.6 oz (115.032 kg)  BMI 32.55 kg/m2  Wt Readings from Last 3 Encounters:  09/30/15 253 lb 9.6 oz (115.032 kg)  07/07/13 242 lb 1 oz (109.8 kg)    Physical Exam  Constitutional: He is oriented to person, place, and time. He appears well-developed and well-nourished. No distress.  Eyes: Conjunctivae and EOM are normal. Pupils are equal, round, and reactive to light. Right eye exhibits no discharge. No scleral icterus.  Cardiovascular: Normal rate, regular rhythm, normal heart sounds and intact distal pulses.   No murmur heard. Pulmonary/Chest: Effort normal and breath sounds normal. No respiratory distress. He has no wheezes.  Abdominal: He exhibits no distension.  Musculoskeletal: Normal range of motion. He  exhibits no edema.  Neurological: He is alert and oriented to person, place, and time. Coordination normal.  Skin: Skin is warm and dry. No rash noted. He is not diaphoretic.  Psychiatric: He has a normal mood and affect. His behavior is normal.  Vitals reviewed.  Diabetic Foot Exam - Simple   Simple Foot Form  Diabetic Foot exam was performed with the following findings:  Yes 09/30/2015  4:00 PM  Visual Inspection  No deformities, no ulcerations, no other skin breakdown bilaterally:  Yes  Sensation Testing  Intact to touch and monofilament testing bilaterally:  Yes  Pulse Check  Posterior Tibialis and Dorsalis pulse intact bilaterally:  Yes  Comments     Results for orders placed or performed in visit on 09/30/15  POCT glycosylated hemoglobin (Hb A1C)  Result Value Ref Range   Hemoglobin A1C >14.0   POCT glucose (manual entry)  Result Value Ref Range   POC Glucose 406 (A) 70 - 99 mg/dl      Assessment & Plan:   Problem List Items Addressed This Visit    None    Visit Diagnoses    Type 2 diabetes mellitus without complication, unspecified long term insulin use status (HCC)    -  Primary    Relevant Medications    atorvastatin (LIPITOR) 40 MG tablet    Other Relevant Orders    POCT glycosylated hemoglobin (Hb A1C) (Completed)    POCT glucose (manual entry) (Completed)    Microalbumin / creatinine urine ratio (Completed)    CMP14+EGFR (Completed)    Lipid panel (Completed)    Hyperlipidemia LDL goal <100        Relevant Medications    atorvastatin (LIPITOR) 40 MG tablet        Follow up plan: Return in about 4 weeks (around 10/28/2015), or if symptoms worsen or fail to improve, for f/u diabetes in 1 month and Referral to [facility] for diabetes.  Caryl Pina, MD [facility] 09/30/2015, 1:06 PM

## 2015-10-01 ENCOUNTER — Telehealth: Payer: Self-pay

## 2015-10-01 LAB — CMP14+EGFR
ALBUMIN: 4.4 g/dL (ref 3.5–5.5)
ALT: 11 IU/L (ref 0–44)
AST: 10 IU/L (ref 0–40)
Albumin/Globulin Ratio: 1.4 (ref 1.1–2.5)
Alkaline Phosphatase: 112 IU/L (ref 39–117)
BUN / CREAT RATIO: 14 (ref 9–20)
BUN: 18 mg/dL (ref 6–24)
Bilirubin Total: 0.5 mg/dL (ref 0.0–1.2)
CHLORIDE: 91 mmol/L — AB (ref 96–106)
CO2: 24 mmol/L (ref 18–29)
Calcium: 10 mg/dL (ref 8.7–10.2)
Creatinine, Ser: 1.32 mg/dL — ABNORMAL HIGH (ref 0.76–1.27)
GFR calc non Af Amer: 59 mL/min/{1.73_m2} — ABNORMAL LOW (ref >59)
GFR, EST AFRICAN AMERICAN: 68 mL/min/{1.73_m2} (ref >59)
Globulin, Total: 3.2 g/dL (ref 1.5–4.5)
Glucose: 445 mg/dL (ref 65–99)
POTASSIUM: 5.3 mmol/L — AB (ref 3.5–5.2)
Sodium: 133 mmol/L — ABNORMAL LOW (ref 134–144)
Total Protein: 7.6 g/dL (ref 6.0–8.5)

## 2015-10-01 LAB — LIPID PANEL
CHOLESTEROL TOTAL: 260 mg/dL — AB (ref 100–199)
Chol/HDL Ratio: 5.2 ratio units — ABNORMAL HIGH (ref 0.0–5.0)
HDL: 50 mg/dL (ref >39)
LDL Calculated: 174 mg/dL — ABNORMAL HIGH (ref 0–99)
Triglycerides: 180 mg/dL — ABNORMAL HIGH (ref 0–149)
VLDL CHOLESTEROL CAL: 36 mg/dL (ref 5–40)

## 2015-10-01 LAB — MICROALBUMIN / CREATININE URINE RATIO
Creatinine, Urine: 30.3 mg/dL
MICROALB/CREAT RATIO: 244.6 mg/g creat — ABNORMAL HIGH (ref 0.0–30.0)
Microalbumin, Urine: 74.1 ug/mL

## 2015-10-01 MED ORDER — SITAGLIPTIN PHOS-METFORMIN HCL 50-1000 MG PO TABS: 1.0000 | ORAL_TABLET | Freq: Two times a day (BID) | ORAL | Status: DC

## 2015-10-01 MED ORDER — ATORVASTATIN CALCIUM 40 MG PO TABS: 40.0000 mg | ORAL_TABLET | Freq: Every day | ORAL | Status: DC

## 2015-10-01 NOTE — Telephone Encounter (Signed)
Spoke with patient, put a rx and rx card up front for 30 days free until we can get prior [place]

## 2015-10-01 NOTE — Telephone Encounter (Signed)
What is the status of the prior authorization for the Janumet?  I contacted the patient to inform him of his lab work and he asked about it.

## 2015-10-07 ENCOUNTER — Telehealth: Payer: Self-pay

## 2015-10-07 MED ORDER — SAXAGLIPTIN-METFORMIN ER 2.5-1000 MG PO TB24: 1.0000 | ORAL_TABLET | Freq: Two times a day (BID) | ORAL | Status: DC

## 2015-10-07 NOTE — Telephone Encounter (Signed)
Insurance recommended to send kombiglyze instead of [place]. Sent the medication, we also have a coupon for him I cannot remember when insurance he has and it is not listed on his chart.

## 2015-10-07 NOTE — Telephone Encounter (Signed)
Insurance denied prior authorization for Janumet  The alternative that must be tried is [employer]

## 2015-10-08 NOTE — Telephone Encounter (Signed)
Coupon mailed to patient.

## 2015-11-01 ENCOUNTER — Ambulatory Visit (INDEPENDENT_AMBULATORY_CARE_PROVIDER_SITE_OTHER): Payer: BLUE CROSS/BLUE SHIELD | Admitting: Family Medicine

## 2015-11-01 ENCOUNTER — Encounter: Payer: Self-pay | Admitting: Gastroenterology

## 2015-11-01 ENCOUNTER — Encounter: Payer: Self-pay | Admitting: Family Medicine

## 2015-11-01 VITALS — BP 136/88 | HR 69 | Temp 97.5°F | Ht 74.0 in | Wt 258.6 lb

## 2015-11-01 DIAGNOSIS — E1165 Type 2 diabetes mellitus with hyperglycemia: Secondary | ICD-10-CM

## 2015-11-01 DIAGNOSIS — IMO0002 Reserved for concepts with insufficient information to code with codable children: Secondary | ICD-10-CM

## 2015-11-01 DIAGNOSIS — E1129 Type 2 diabetes mellitus with other diabetic kidney complication: Secondary | ICD-10-CM | POA: Diagnosis not present

## 2015-11-01 DIAGNOSIS — Z1211 Encounter for screening for malignant neoplasm of colon: Secondary | ICD-10-CM | POA: Diagnosis not present

## 2015-11-01 DIAGNOSIS — E785 Hyperlipidemia, unspecified: Secondary | ICD-10-CM | POA: Diagnosis not present

## 2015-11-01 MED ORDER — ATORVASTATIN CALCIUM 40 MG PO TABS: 40.0000 mg | ORAL_TABLET | Freq: Every day | ORAL | Status: DC

## 2015-11-01 MED ORDER — SITAGLIPTIN PHOS-METFORMIN HCL 50-1000 MG PO TABS: 1.0000 | ORAL_TABLET | Freq: Two times a day (BID) | ORAL | Status: DC

## 2015-11-01 NOTE — Progress Notes (Signed)
BP 136/88 mmHg  Pulse 69  Temp(Src) 97.5 F (36.4 C) (Oral)  Ht '6\' 2"'$  (1.88 m)  Wt 258 lb 9.6 oz (117.3 kg)  BMI 33.19 kg/m2   Subjective:    Patient ID: [name], male    DOB: 1956-05-24, 60 y.o.   MRN: [id number]  HPI: [name] is a 60 y.o. male presenting on 11/01/2015 for Diabetes and Refill medications   HPI Diabetes recheck Patient is coming in today for a diabetes recheck. His blood sugars that is been checking in the a.m. have been running between 105 and 190. He occasionally checks in the p.m. but not frequently. He is currently taking Janumet and denies any issues with the medications. Patient denies headaches, blurred vision, chest pains, shortness of breath, or weakness. Denies any side effects from medication and is content with current medication. Patient has also been on Lipitor to help manage his risk factors and his cholesterol. He denies any side effects from that but he has not been taking it consistently over the last short period.  Relevant past medical, surgical, family and social history reviewed and updated as indicated. Interim medical history since our last visit reviewed. Allergies and medications reviewed and updated.  Review of Systems  Constitutional: Negative for fever and chills.  HENT: Negative for ear discharge and ear pain.   Eyes: Negative for discharge and visual disturbance.  Respiratory: Negative for shortness of breath and wheezing.   Cardiovascular: Negative for chest pain and leg swelling.  Gastrointestinal: Negative for abdominal pain, diarrhea and constipation.  Genitourinary: Negative for difficulty urinating.  Musculoskeletal: Negative for back pain and gait problem.  Skin: Negative for rash.  Neurological: Negative for dizziness, syncope, light-headedness and headaches.  All other systems reviewed and are negative.   Per HPI unless specifically indicated above     Medication List       This list is accurate  as of: 11/01/15  4:17 PM.  Always use your most recent med list.               atorvastatin 40 MG tablet  Commonly known as:  LIPITOR  Take 1 tablet (40 mg total) by mouth daily at 6 PM.     Saxagliptin-Metformin 2.01-999 MG Tb24  Take 1 tablet by mouth 2 (two) times daily.     sitaGLIPtin-metformin 50-1000 MG tablet  Commonly known as:  JANUMET  Take 1 tablet by mouth 2 (two) times daily with a meal.           Objective:    BP 136/88 mmHg  Pulse 69  Temp(Src) 97.5 F (36.4 C) (Oral)  Ht '6\' 2"'$  (1.88 m)  Wt 258 lb 9.6 oz (117.3 kg)  BMI 33.19 kg/m2  Wt Readings from Last 3 Encounters:  11/01/15 258 lb 9.6 oz (117.3 kg)  09/30/15 253 lb 9.6 oz (115.032 kg)  07/07/13 242 lb 1 oz (109.8 kg)    Physical Exam  Constitutional: He is oriented to person, place, and time. He appears well-developed and well-nourished. No distress.  Eyes: Conjunctivae and EOM are normal. Pupils are equal, round, and reactive to light. Right eye exhibits no discharge. No scleral icterus.  Neck: Neck supple. No thyromegaly present.  Cardiovascular: Normal rate, regular rhythm, normal heart sounds and intact distal pulses.   No murmur heard. Pulmonary/Chest: Effort normal and breath sounds normal. No respiratory distress. He has no wheezes.  Musculoskeletal: Normal range of motion. He exhibits no edema.  Lymphadenopathy:  He has no cervical adenopathy.  Neurological: He is alert and oriented to person, place, and time. Coordination normal.  Skin: Skin is warm and dry. No rash noted. He is not diaphoretic.  Psychiatric: He has a normal mood and affect. His behavior is normal.  Nursing note and vitals reviewed.   Results for orders placed or performed in visit on 09/30/15  Microalbumin / creatinine urine ratio  Result Value Ref Range   Creatinine, Urine 30.3 Not Estab. mg/dL   Microalbum.,U,Random 74.1 Not Estab. ug/mL   MICROALB/CREAT RATIO 244.6 (H) 0.0 - 30.0 mg/g creat  CMP14+EGFR    Result Value Ref Range   Glucose 445 (>) 65 - 99 mg/dL   BUN 18 6 - 24 mg/dL   Creatinine, Ser 1.32 (H) 0.76 - 1.27 mg/dL   GFR calc non Af Amer 59 (L) >59 mL/min/1.73   GFR calc Af Amer 68 >59 mL/min/1.73   BUN/Creatinine Ratio 14 9 - 20   Sodium 133 (L) 134 - 144 mmol/L   Potassium 5.3 (H) 3.5 - 5.2 mmol/L   Chloride 91 (L) 96 - 106 mmol/L   CO2 24 18 - 29 mmol/L   Calcium 10.0 8.7 - 10.2 mg/dL   Total Protein 7.6 6.0 - 8.5 g/dL   Albumin 4.4 3.5 - 5.5 g/dL   Globulin, Total 3.2 1.5 - 4.5 g/dL   Albumin/Globulin Ratio 1.4 1.1 - 2.5   Bilirubin Total 0.5 0.0 - 1.2 mg/dL   Alkaline Phosphatase 112 39 - 117 IU/L   AST 10 0 - 40 IU/L   ALT 11 0 - 44 IU/L  Lipid panel  Result Value Ref Range   Cholesterol, Total 260 (H) 100 - 199 mg/dL   Triglycerides 180 (H) 0 - 149 mg/dL   HDL 50 >39 mg/dL   VLDL Cholesterol Cal 36 5 - 40 mg/dL   LDL Calculated 174 (H) 0 - 99 mg/dL   Chol/HDL Ratio 5.2 (H) 0.0 - 5.0 ratio units  POCT glycosylated hemoglobin (Hb A1C)  Result Value Ref Range   Hemoglobin A1C >14.0   POCT glucose (manual entry)  Result Value Ref Range   POC Glucose 406 (A) 70 - 99 mg/dl      Assessment & Plan:   Problem List Items Addressed This Visit      Endocrine   DM (diabetes mellitus), type 2, uncontrolled (HCC)   Relevant Medications   sitaGLIPtin-metformin (JANUMET) 50-1000 MG tablet   atorvastatin (LIPITOR) 40 MG tablet    Other Visit Diagnoses    Special screening for malignant neoplasms, colon    -  Primary    Relevant Orders    Ambulatory referral to Gastroenterology    Hyperlipidemia LDL goal <100        Relevant Medications    atorvastatin (LIPITOR) 40 MG tablet        Follow up plan: Return in about 8 years (around 11/01/2023), or if symptoms worsen or fail to improve, for dm recheck.  Counseling provided for all of the vaccine components Orders Placed This Encounter  Procedures  . Ambulatory referral to Gastroenterology    Caryl Pina, MD [facility] 11/01/2015, 4:17 PM

## 2015-11-02 ENCOUNTER — Telehealth: Payer: Self-pay

## 2015-11-02 NOTE — Telephone Encounter (Signed)
Janumet non preferred with insurance    Kombiglyze preferred   Do you want to change?

## 2015-11-03 ENCOUNTER — Telehealth: Payer: Self-pay | Admitting: Family Medicine

## 2015-11-03 ENCOUNTER — Ambulatory Visit: Payer: BLUE CROSS/BLUE SHIELD | Admitting: Pharmacist

## 2015-11-03 MED ORDER — SAXAGLIPTIN-METFORMIN ER 2.5-1000 MG PO TB24: 1.0000 | ORAL_TABLET | Freq: Two times a day (BID) | ORAL | Status: DC

## 2015-11-03 NOTE — Telephone Encounter (Signed)
Coupon for [employer] placed at front desk, patient informed and he will come by to pick up.

## 2015-11-04 ENCOUNTER — Telehealth: Payer: Self-pay

## 2015-11-04 ENCOUNTER — Encounter: Payer: Self-pay | Admitting: Family Medicine

## 2015-11-04 NOTE — Telephone Encounter (Signed)
I just sent yesterday to [facility], I don't know if that was the right pharmacy will have to double check with him. If it is not the right pharmacy that was go ahead and send it again to the right pharmacy.

## 2015-11-04 NOTE — Telephone Encounter (Signed)
Told the pharmacy that his med was changed to [facility]  But they say they have not gotten a RX for it

## 2015-12-09 ENCOUNTER — Telehealth: Payer: Self-pay | Admitting: *Deleted

## 2015-12-09 NOTE — Telephone Encounter (Signed)
Pt no showed his PV today at 1 pm. Attempted to call pt, home and moblie number is the same and states this persons voice mail box has not been set up yet, please try your call again, so unable to LM. Mailed letter to patient, cancelled PV and scheduled colon. [name] PV

## 2015-12-23 ENCOUNTER — Encounter: Payer: Self-pay | Admitting: Gastroenterology

## 2016-01-04 ENCOUNTER — Ambulatory Visit: Payer: BLUE CROSS/BLUE SHIELD | Admitting: Family Medicine

## 2016-01-10 ENCOUNTER — Ambulatory Visit: Payer: BLUE CROSS/BLUE SHIELD | Admitting: Family Medicine

## 2016-01-19 ENCOUNTER — Telehealth: Payer: Self-pay

## 2016-01-19 NOTE — Telephone Encounter (Signed)
Attempted all numbers. No answer or vm to leave message. PV appt missed and needs to be rescheduled. [name]-PV

## 2016-02-02 ENCOUNTER — Encounter: Payer: Self-pay | Admitting: Gastroenterology

## 2016-03-02 ENCOUNTER — Emergency Department (HOSPITAL_COMMUNITY)
Admission: EM | Admit: 2016-03-02 | Discharge: 2016-03-02 | Discharge status: Against Medical Advice | Payer: BLUE CROSS/BLUE SHIELD

## 2016-03-02 NOTE — ED Notes (Signed)
No answer from pt when called for room for a final time.

## 2016-03-02 NOTE — ED Notes (Signed)
Called for 2nd time with no response

## 2016-03-02 NOTE — ED Notes (Signed)
Pt called for vitals no answer from lobby

## 2016-09-23 ENCOUNTER — Emergency Department (HOSPITAL_COMMUNITY)
Admission: EM | Admit: 2016-09-23 | Discharge: 2016-09-24 | Disposition: A | Discharge status: Home / Self Care | Payer: BLUE CROSS/BLUE SHIELD | Attending: Emergency Medicine | Admitting: Emergency Medicine

## 2016-09-23 ENCOUNTER — Encounter (HOSPITAL_COMMUNITY): Payer: Self-pay | Admitting: Nurse Practitioner

## 2016-09-23 DIAGNOSIS — Z79899 Other long term (current) drug therapy: Secondary | ICD-10-CM | POA: Insufficient documentation

## 2016-09-23 DIAGNOSIS — E1165 Type 2 diabetes mellitus with hyperglycemia: Secondary | ICD-10-CM | POA: Insufficient documentation

## 2016-09-23 DIAGNOSIS — R739 Hyperglycemia, unspecified: Secondary | ICD-10-CM

## 2016-09-23 DIAGNOSIS — Z7984 Long term (current) use of oral hypoglycemic drugs: Secondary | ICD-10-CM | POA: Insufficient documentation

## 2016-09-23 DIAGNOSIS — F17210 Nicotine dependence, cigarettes, uncomplicated: Secondary | ICD-10-CM | POA: Insufficient documentation

## 2016-09-23 LAB — BASIC METABOLIC PANEL
Anion gap: 8 (ref 5–15)
BUN: 24 mg/dL — AB (ref 6–20)
CO2: 23 mmol/L (ref 22–32)
CREATININE: 1.36 mg/dL — AB (ref 0.61–1.24)
Calcium: 9.4 mg/dL (ref 8.9–10.3)
Chloride: 103 mmol/L (ref 101–111)
GFR calc Af Amer: >60 mL/min (ref >60)
GFR, EST NON AFRICAN AMERICAN: 55 mL/min — AB (ref >60)
Glucose, Bld: 267 mg/dL — ABNORMAL HIGH (ref 65–99)
Potassium: 4.1 mmol/L (ref 3.5–5.1)
Sodium: 134 mmol/L — ABNORMAL LOW (ref 135–145)

## 2016-09-23 LAB — CBC
HCT: 41.4 % (ref 39.0–52.0)
Hemoglobin: 14.7 g/dL (ref 13.0–17.0)
MCH: 27.2 pg (ref 26.0–34.0)
MCHC: 35.5 g/dL (ref 30.0–36.0)
MCV: 76.5 fL — AB (ref 78.0–100.0)
PLATELETS: 214 10*3/uL (ref 150–400)
RBC: 5.41 MIL/uL (ref 4.22–5.81)
RDW: 14.2 % (ref 11.5–15.5)
WBC: 8.2 10*3/uL (ref 4.0–10.5)

## 2016-09-23 LAB — CBG MONITORING, ED: GLUCOSE-CAPILLARY: 370 mg/dL — AB (ref 65–99)

## 2016-09-23 NOTE — ED Triage Notes (Signed)
Pt c/o elevated blood glucose and abdominal pain with polyuria. Denies any other symptoms.

## 2016-09-24 LAB — URINALYSIS, ROUTINE W REFLEX MICROSCOPIC
Bacteria, UA: NONE SEEN
Bilirubin Urine: NEGATIVE
GLUCOSE, UA: 150 mg/dL — AB
Hgb urine dipstick: NEGATIVE
Ketones, ur: NEGATIVE mg/dL
Leukocytes, UA: NEGATIVE
NITRITE: NEGATIVE
Protein, ur: 30 mg/dL — AB
Specific Gravity, Urine: 1.013 (ref 1.005–1.030)
Squamous Epithelial / LPF: NONE SEEN
pH: 5 (ref 5.0–8.0)

## 2016-09-24 LAB — CBG MONITORING, ED: GLUCOSE-CAPILLARY: 168 mg/dL — AB (ref 65–99)

## 2016-09-24 MED ORDER — SODIUM CHLORIDE 0.9 % IV BOLUS (SEPSIS)
1000.0000 mL | Freq: Once | INTRAVENOUS | Status: AC
  Administered 2016-09-24: 1000 mL via INTRAVENOUS

## 2016-09-24 MED ORDER — INSULIN ASPART 100 UNIT/ML ~~LOC~~ SOLN
4.0000 [IU] | Freq: Once | SUBCUTANEOUS | Status: AC
  Administered 2016-09-24: 4 [IU] via SUBCUTANEOUS
  Filled 2016-09-24: qty 1

## 2016-09-24 NOTE — Discharge Instructions (Signed)
Continue taking your medications as prescribed. I recommend following up with your primary care provider within the next week for reevaluation and further management regarding your diabetes. Please return to the Emergency Department if symptoms worsen or new onset of fever, chest pain, difficulty breathing, abdominal pain, vomiting, diarrhea, urinary sxs.

## 2016-09-24 NOTE — ED Provider Notes (Signed)
[facility] DEPT Provider Note   CSN: [id number] Arrival date & time: 09/23/16  1929  By signing my name below, I, Jeanell Sparrow, attest that this documentation has been prepared under the direction and in the presence of non-physician practitioner, Harlene Ramus, PA. Electronically Signed: Jeanell Sparrow, Scribe. 09/24/2016. 12:18 AM.  History   Chief Complaint Chief Complaint  Patient presents with  . Hyperglycemia   The history is provided by the patient. No language interpreter was used.   HPI Comments: [name] is a 61 y.o. male with a PMHx of type II DM who presents to the Emergency Department complaining of hyperglycemia that started a few days ago. He states that today his symptoms concerned him so he came to the ED. No blood sugar measurement at home. He has been taking metformin-saxagliptin as prescribed for the past year for his DM. He reports associated symptoms of polydipsia and polyuria. He denies any fever, headache, lightheadedness, dizziness, rhinorrhea, SOB, cough, chest pain, abdominal pain, vomiting, diarrhea, dysuria, rash, numbness/tingling, or other complaints.     PCP: Worthy Rancher, MD  Past Medical History:  Diagnosis Date  . Diabetes mellitus without complication [facility])     Patient Active Problem List   Diagnosis Date Noted  . Lower leg crush injury 07/11/2013  . Elevated blood pressure 07/09/2013  . Rhabdomyolysis 07/09/2013  . DM (diabetes mellitus), type 2, uncontrolled ([place]) 07/08/2013  . Right leg injury 07/08/2013  . Tobacco abuse 07/08/2013  . 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 APPENDECTOMY         Home Medications    Prior to Admission medications   Medication Sig Start Date End Date Taking? Authorizing Provider  atorvastatin (LIPITOR) 40 MG tablet Take 1 tablet (40 mg total) by mouth daily at 6 PM. 11/01/15  Yes Fransisca Kaufmann Dettinger, MD    Saxagliptin-Metformin 2.01-999 MG TB24 Take 1 tablet by mouth 2 (two) times daily. 11/03/15  Yes Fransisca Kaufmann Dettinger, MD    Family History Family History  Problem Relation Age of Onset  . Heart disease Mother   . Hypertension Mother   . Diabetes Mother   . Alcohol abuse Father   . Diabetes Brother   . Hypertension Brother   . Stroke Sister   . Hypertension Sister   . Diabetes Sister     Social History Social History  Substance Use Topics  . Smoking status: Current Every Day Smoker    Packs/day: 0.50    Years: 40.00    Types: Cigarettes  . Smokeless tobacco: Never Used  . Alcohol use No     Allergies   Patient has no known allergies.   Review of Systems Review of Systems  Constitutional: Negative for fever.  Endocrine: Positive for polydipsia and polyuria.  Skin: Negative for rash.  Neurological: Negative for dizziness, light-headedness, numbness and headaches.     Physical Exam Updated Vital Signs BP 152/84   Pulse 69   Temp 97.7 F (36.5 C) (Oral)   Resp 16   SpO2 100%   Physical Exam  Constitutional: He is oriented to person, place, and time. He appears well-developed and well-nourished. No distress.  HENT:  Head: Normocephalic and atraumatic.  Mouth/Throat: Uvula is midline, oropharynx is clear and moist and mucous membranes are normal. No oropharyngeal exudate, posterior oropharyngeal edema, posterior oropharyngeal erythema or tonsillar abscesses. No tonsillar exudate.  Eyes: Conjunctivae and EOM are normal. Right eye exhibits no  discharge. Left eye exhibits no discharge. No scleral icterus.  Neck: Normal range of motion. Neck supple.  Cardiovascular: Normal rate, regular rhythm, normal heart sounds and intact distal pulses.   Pulmonary/Chest: Effort normal and breath sounds normal. No respiratory distress. He has no wheezes. He has no rales. He exhibits no tenderness.  Abdominal: Soft. Bowel sounds are normal. He exhibits no distension and no mass.  There is no tenderness. There is no rebound and no guarding. No hernia.  Musculoskeletal: Normal range of motion. He exhibits no edema.  Neurological: He is alert and oriented to person, place, and time.  Skin: Skin is warm and dry. He is not diaphoretic.  Nursing note and vitals reviewed.    ED Treatments / Results  DIAGNOSTIC STUDIES: Oxygen Saturation is 100% on RA, normal by my interpretation.    COORDINATION OF CARE: 12:22 AM- Pt advised of plan for treatment and pt agrees.  Labs (all labs ordered are listed, but only abnormal results are displayed) Labs Reviewed  BASIC METABOLIC PANEL - Abnormal; Notable for the following:       Result Value   Sodium 134 (*)    Glucose, Bld 267 (*)    BUN 24 (*)    Creatinine, Ser 1.36 (*)    GFR calc non Af Amer 55 (*)    All other components within normal limits  CBC - Abnormal; Notable for the following:    MCV 76.5 (*)    All other components within normal limits  URINALYSIS, ROUTINE W REFLEX MICROSCOPIC - Abnormal; Notable for the following:    Glucose, UA 150 (*)    Protein, ur 30 (*)    All other components within normal limits  CBG MONITORING, ED - Abnormal; Notable for the following:    Glucose-Capillary 370 (*)    All other components within normal limits  CBG MONITORING, ED - Abnormal; Notable for the following:    Glucose-Capillary 168 (*)    All other components within normal limits  CBG MONITORING, ED    EKG  EKG Interpretation None       Radiology No results found.  Procedures Procedures (including critical care time)  Medications Ordered in ED Medications  sodium chloride 0.9 % bolus 1,000 mL (0 mLs Intravenous Stopped 09/24/16 0256)  insulin aspart (novoLOG) injection 4 Units (4 Units Subcutaneous Given 09/24/16 0058)     Initial Impression / Assessment and Plan / ED Course  I have reviewed the triage vital signs and the nursing notes.  Pertinent labs & imaging results that were available during my  care of the patient were reviewed by me and considered in my medical decision making (see chart for details).     Patient presents with polydipsia and polyuria for the past 2 days. Denies any other associated symptoms. Patient reports he has been taking his home medications including metformin-saxagliptin as prescribed. VSS. Exam unremarkable. Labs revealed glucose 267, no anion gap. UA positive for glucose, no ketones present, no signs of infection. Remaining labs unremarkable. Patient given IV fluids and 4 units of insulin. On reevaluation patient is laying in bed resting comfortably in no acute distress, denies any pain or complaints at this time. Repeat CBG 168. Patient able to tolerate by mouth. Discussed results and plan for discharge with patient. Advised patient to continue taking his home medications as prescribed and recommended following up with his PCP this week for follow-up evaluation and further management of his diabetes. Discussed return precautions.  Final Clinical Impressions(s) /  ED Diagnoses   Final diagnoses:  Hyperglycemia    New Prescriptions New Prescriptions   No medications on file   I personally performed the services described in this documentation, which was scribed in my presence. The recorded information has been reviewed and is accurate.     Chesley Noon [place], [place] [id number]    Delora Fuel, MD [id number]

## 2016-10-06 ENCOUNTER — Telehealth: Payer: Self-pay | Admitting: Pharmacist

## 2016-10-06 NOTE — Telephone Encounter (Signed)
Tried to call to follow up DM and make appt for patient to see PCP.   It appears he might have moved to [facility] He was seen in [facility] ER about 2 weeks ago for elevated BG.  I was unable to reach patient or leave message on his primary phone.

## 2020-02-16 ENCOUNTER — Encounter (HOSPITAL_COMMUNITY): Payer: Self-pay | Admitting: Emergency Medicine

## 2020-02-16 ENCOUNTER — Ambulatory Visit (HOSPITAL_COMMUNITY)
Admission: EM | Admit: 2020-02-16 | Discharge: 2020-02-16 | Disposition: A | Discharge status: Home / Self Care | Payer: BLUE CROSS/BLUE SHIELD

## 2020-02-16 ENCOUNTER — Emergency Department (HOSPITAL_COMMUNITY): Payer: Medicaid Other

## 2020-02-16 ENCOUNTER — Emergency Department (HOSPITAL_COMMUNITY)
Admission: EM | Admit: 2020-02-16 | Discharge: 2020-02-16 | Disposition: A | Discharge status: Home / Self Care | Payer: Medicaid Other | Attending: Emergency Medicine | Admitting: Emergency Medicine

## 2020-02-16 DIAGNOSIS — R531 Weakness: Secondary | ICD-10-CM | POA: Diagnosis not present

## 2020-02-16 DIAGNOSIS — R110 Nausea: Secondary | ICD-10-CM | POA: Insufficient documentation

## 2020-02-16 DIAGNOSIS — R1084 Generalized abdominal pain: Secondary | ICD-10-CM | POA: Insufficient documentation

## 2020-02-16 DIAGNOSIS — E119 Type 2 diabetes mellitus without complications: Secondary | ICD-10-CM | POA: Insufficient documentation

## 2020-02-16 DIAGNOSIS — Z7984 Long term (current) use of oral hypoglycemic drugs: Secondary | ICD-10-CM | POA: Diagnosis not present

## 2020-02-16 DIAGNOSIS — R1010 Upper abdominal pain, unspecified: Secondary | ICD-10-CM

## 2020-02-16 DIAGNOSIS — R030 Elevated blood-pressure reading, without diagnosis of hypertension: Secondary | ICD-10-CM

## 2020-02-16 DIAGNOSIS — F17210 Nicotine dependence, cigarettes, uncomplicated: Secondary | ICD-10-CM | POA: Diagnosis not present

## 2020-02-16 DIAGNOSIS — R630 Anorexia: Secondary | ICD-10-CM

## 2020-02-16 DIAGNOSIS — R634 Abnormal weight loss: Secondary | ICD-10-CM

## 2020-02-16 DIAGNOSIS — N182 Chronic kidney disease, stage 2 (mild): Secondary | ICD-10-CM

## 2020-02-16 LAB — URINALYSIS, ROUTINE W REFLEX MICROSCOPIC
Bacteria, UA: NONE SEEN
Bilirubin Urine: NEGATIVE
Glucose, UA: NEGATIVE mg/dL
Hgb urine dipstick: NEGATIVE
Ketones, ur: NEGATIVE mg/dL
Leukocytes,Ua: NEGATIVE
Nitrite: NEGATIVE
Protein, ur: >=300 mg/dL — AB
Specific Gravity, Urine: 1.012 (ref 1.005–1.030)
pH: 7 (ref 5.0–8.0)

## 2020-02-16 LAB — CBC
HCT: 40.7 % (ref 39.0–52.0)
Hemoglobin: 13.2 g/dL (ref 13.0–17.0)
MCH: 26.1 pg (ref 26.0–34.0)
MCHC: 32.4 g/dL (ref 30.0–36.0)
MCV: 80.6 fL (ref 80.0–100.0)
Platelets: 284 10*3/uL (ref 150–400)
RBC: 5.05 MIL/uL (ref 4.22–5.81)
RDW: 15.4 % (ref 11.5–15.5)
WBC: 14.7 10*3/uL — ABNORMAL HIGH (ref 4.0–10.5)
nRBC: 0 % (ref 0.0–0.2)

## 2020-02-16 LAB — COMPREHENSIVE METABOLIC PANEL
ALT: 43 U/L (ref 0–44)
AST: 52 U/L — ABNORMAL HIGH (ref 15–41)
Albumin: 2.9 g/dL — ABNORMAL LOW (ref 3.5–5.0)
Alkaline Phosphatase: 407 U/L — ABNORMAL HIGH (ref 38–126)
Anion gap: 12 (ref 5–15)
BUN: 15 mg/dL (ref 8–23)
CO2: 24 mmol/L (ref 22–32)
Calcium: 9.2 mg/dL (ref 8.9–10.3)
Chloride: 100 mmol/L (ref 98–111)
Creatinine, Ser: 1.48 mg/dL — ABNORMAL HIGH (ref 0.61–1.24)
GFR calc Af Amer: 58 mL/min — ABNORMAL LOW (ref >60)
GFR calc non Af Amer: 50 mL/min — ABNORMAL LOW (ref >60)
Glucose, Bld: 167 mg/dL — ABNORMAL HIGH (ref 70–99)
Potassium: 3.5 mmol/L (ref 3.5–5.1)
Sodium: 136 mmol/L (ref 135–145)
Total Bilirubin: 2.3 mg/dL — ABNORMAL HIGH (ref 0.3–1.2)
Total Protein: 7.4 g/dL (ref 6.5–8.1)

## 2020-02-16 LAB — CBG MONITORING, ED: Glucose-Capillary: 228 mg/dL — ABNORMAL HIGH (ref 70–99)

## 2020-02-16 LAB — LIPASE, BLOOD: Lipase: 20 U/L (ref 11–51)

## 2020-02-16 MED ORDER — SODIUM CHLORIDE 0.9 % IV BOLUS
1000.0000 mL | Freq: Once | INTRAVENOUS | Status: AC
Start: 2020-02-16 — End: 2020-02-16
  Administered 2020-02-16: 1000 mL via INTRAVENOUS

## 2020-02-16 MED ORDER — IOHEXOL 300 MG/ML  SOLN
100.0000 mL | Freq: Once | INTRAMUSCULAR | Status: AC | PRN
  Administered 2020-02-16: 100 mL via INTRAVENOUS

## 2020-02-16 MED ORDER — OXYCODONE HCL 5 MG PO TABS: 5.0000 mg | ORAL_TABLET | Freq: Four times a day (QID) | ORAL | 0 refills | Status: DC | PRN

## 2020-02-16 MED ORDER — SODIUM CHLORIDE 0.9% FLUSH
3.0000 mL | Freq: Once | INTRAVENOUS | Status: AC
  Administered 2020-02-16: 3 mL via INTRAVENOUS

## 2020-02-16 NOTE — ED Triage Notes (Signed)
Pt. Stated, [name] had stomach pain with burning and just feeling weak all over sometimes throwing up. Just feeling bad for 3 months.

## 2020-02-16 NOTE — Discharge Instructions (Addendum)
Follow-up with oncology. Please return to the ED if symptoms worsen. Please try to establish care at the wellness center with the number provided as well.

## 2020-02-16 NOTE — ED Notes (Signed)
Patient transported to CT 

## 2020-02-16 NOTE — ED Provider Notes (Signed)
[facility]ERG[facility] DEPARTMENT Provider Note   CSN: [id number] Arrival date & time: 02/16/20  1041     History Chief Complaint  Patient presents with  . Abdominal Pain  . Weakness    Blayke O Raisanen is a 64 y.o. male.  Patient c/o general abd pain/discomfort, decreased appetite, and weight loss for the past 2-3 months. Symptoms gradual onset, moderate, persistent, slowly feels worse, generally weak. No abrupt/acute change today. No fever or chills. No vomiting. Is having normal bms. No back or flank pain. No headaches. No chest pain or discomfort. States compliant w home meds, no recent change.   The history is provided by the patient.  Abdominal Pain Associated symptoms: nausea   Associated symptoms: no chest pain, no cough, no diarrhea, no dysuria, no fever, no shortness of breath, no sore throat and no vomiting   Weakness Associated symptoms: abdominal pain and nausea   Associated symptoms: no chest pain, no cough, no diarrhea, no dysuria, no fever, no headaches, no shortness of breath and no vomiting        Past Medical History:  Diagnosis Date  . Diabetes mellitus without complication [facility])     Patient Active Problem List   Diagnosis Date Noted  . Lower leg crush injury 07/11/2013  . Elevated blood pressure 07/09/2013  . Rhabdomyolysis 07/09/2013  . DM (diabetes mellitus), type 2, uncontrolled ([place]) 07/08/2013  . Right leg injury 07/08/2013  . Tobacco abuse 07/08/2013  . 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 APPENDECTOMY         Family History  Problem Relation Age of Onset  . Heart disease Mother   . Hypertension Mother   . Diabetes Mother   . Alcohol abuse Father   . Diabetes Brother   . Hypertension Brother   . Stroke Sister   . Hypertension Sister   . Diabetes Sister     Social History   Tobacco Use  . Smoking status: Current Every Day Smoker     Packs/day: 0.50    Years: 40.00    Pack years: 20.00    Types: Cigarettes  . Smokeless tobacco: Never Used  Substance Use Topics  . Alcohol use: No  . Drug use: No    Types: Cocaine    Comment: used cocaine 2 months    Home Medications Prior to Admission medications   Medication Sig Start Date End Date Taking? Authorizing Provider  atorvastatin (LIPITOR) 40 MG tablet Take 1 tablet (40 mg total) by mouth daily at 6 PM. 11/01/15   Dettinger, Fransisca Kaufmann, MD  Saxagliptin-Metformin 2.01-999 MG TB24 Take 1 tablet by mouth 2 (two) times daily. 11/03/15   Dettinger, Fransisca Kaufmann, MD    Allergies    Patient has no known allergies.  Review of Systems   Review of Systems  Constitutional: Negative for fever.  HENT: Negative for sore throat.   Eyes: Negative for redness.  Respiratory: Negative for cough and shortness of breath.   Cardiovascular: Negative for chest pain and leg swelling.  Gastrointestinal: Positive for abdominal pain and nausea. Negative for diarrhea and vomiting.  Genitourinary: Negative for dysuria and flank pain.  Musculoskeletal: Negative for back pain and neck pain.  Skin: Negative for rash.  Neurological: Positive for weakness. Negative for headaches.  Hematological: Does not bruise/bleed easily.  Psychiatric/Behavioral: Negative for confusion.    Physical Exam Updated Vital Signs BP (!) 162/100 (BP Location: Left Arm)  Pulse 78   Temp (!) 97.5 F (36.4 C) (Oral)   Resp 16   SpO2 100%   Physical Exam Vitals and nursing note reviewed.  Constitutional:      Appearance: Normal appearance. He is well-developed.  HENT:     Head: Atraumatic.     Nose: Nose normal.     Mouth/Throat:     Mouth: Mucous membranes are moist.     Pharynx: Oropharynx is clear.  Eyes:     General: No scleral icterus.    Conjunctiva/sclera: Conjunctivae normal.     Pupils: Pupils are equal, round, and reactive to light.  Neck:     Trachea: No tracheal deviation.  Cardiovascular:      Rate and Rhythm: Normal rate and regular rhythm.     Pulses: Normal pulses.     Heart sounds: Normal heart sounds. No murmur heard.  No friction rub. No gallop.   Pulmonary:     Effort: Pulmonary effort is normal. No accessory muscle usage or respiratory distress.     Breath sounds: Normal breath sounds.  Abdominal:     General: Bowel sounds are normal. There is no distension.     Palpations: Abdomen is soft.     Tenderness: There is no abdominal tenderness. There is no guarding or rebound.     Hernia: No hernia is present.  Genitourinary:    Comments: No cva tenderness. Musculoskeletal:        General: No swelling or tenderness.     Cervical back: Normal range of motion and neck supple. No rigidity.  Skin:    General: Skin is warm and dry.     Findings: No rash.  Neurological:     Mental Status: He is alert.     Comments: Alert, speech clear. No aphasia or dysarthria. Motor/sens grossly intact bil. Steady gait.   Psychiatric:        Mood and Affect: Mood normal.     ED Results / Procedures / Treatments   Labs (all labs ordered are listed, but only abnormal results are displayed) Results for orders placed or performed during the hospital encounter of 02/16/20  Lipase, blood  Result Value Ref Range   Lipase 20 11 - 51 U/L  Comprehensive metabolic panel  Result Value Ref Range   Sodium 136 135 - 145 mmol/L   Potassium 3.5 3.5 - 5.1 mmol/L   Chloride 100 98 - 111 mmol/L   CO2 24 22 - 32 mmol/L   Glucose, Bld 167 (H) 70 - 99 mg/dL   BUN 15 8 - 23 mg/dL   Creatinine, Ser 1.48 (H) 0.61 - 1.24 mg/dL   Calcium 9.2 8.9 - 10.3 mg/dL   Total Protein 7.4 6.5 - 8.1 g/dL   Albumin 2.9 (L) 3.5 - 5.0 g/dL   AST 52 (H) 15 - 41 U/L   ALT 43 0 - 44 U/L   Alkaline Phosphatase 407 (H) 38 - 126 U/L   Total Bilirubin 2.3 (H) 0.3 - 1.2 mg/dL   GFR calc non Af Amer 50 (L) >60 mL/min   GFR calc Af Amer 58 (L) >60 mL/min   Anion gap 12 5 - 15  CBC  Result Value Ref Range   WBC 14.7 (H)  4.0 - 10.5 K/uL   RBC 5.05 4.22 - 5.81 MIL/uL   Hemoglobin 13.2 13.0 - 17.0 g/dL   HCT 40.7 39 - 52 %   MCV 80.6 80.0 - 100.0 fL   MCH 26.1 26.0 -  34.0 pg   MCHC 32.4 30.0 - 36.0 g/dL   RDW 15.4 11.5 - 15.5 %   Platelets 284 150 - 400 K/uL   nRBC 0.0 0.0 - 0.2 %  Urinalysis, Routine w reflex microscopic  Result Value Ref Range   Color, Urine YELLOW YELLOW   APPearance CLEAR CLEAR   Specific Gravity, Urine 1.012 1.005 - 1.030   pH 7.0 5.0 - 8.0   Glucose, UA NEGATIVE NEGATIVE mg/dL   Hgb urine dipstick NEGATIVE NEGATIVE   Bilirubin Urine NEGATIVE NEGATIVE   Ketones, ur NEGATIVE NEGATIVE mg/dL   Protein, ur >=300 (A) NEGATIVE mg/dL   Nitrite NEGATIVE NEGATIVE   Leukocytes,Ua NEGATIVE NEGATIVE   RBC / HPF 0-5 0 - 5 RBC/hpf   WBC, UA 0-5 0 - 5 WBC/hpf   Bacteria, UA NONE SEEN NONE SEEN  CBG monitoring, ED  Result Value Ref Range   Glucose-Capillary 228 (H) 70 - 99 mg/dL    EKG EKG Interpretation  Date/Time:  Monday February 16 2020 10:42:27 EDT Ventricular Rate:  76 PR Interval:  128 QRS Duration: 88 QT Interval:  404 QTC Calculation: 454 R Axis:   -49 Text Interpretation: Normal sinus rhythm Left axis deviation Left anterior fascicular block Moderate voltage criteria for LVH, may be normal variant Non-specific ST-t changes No previous tracing Confirmed by Lajean Saver [phone]) on 02/16/2020 1:59:11 PM   Radiology No results found.  Procedures Procedures (including critical care time)  Medications Ordered in ED Medications  sodium chloride flush (NS) 0.9 % injection 3 mL (has no administration in time range)  sodium chloride 0.9 % bolus 1,000 mL (has no administration in time range)    ED Course  I have reviewed the triage vital signs and the nursing notes.  Pertinent labs & imaging results that were available during my care of the patient were reviewed by me and considered in my medical decision making (see chart for details).    MDM Rules/Calculators/A&P                          Iv ns. Stat labs.   Reviewed nursing notes and prior charts for additional history.   Ns bolus.   Labs reviewed/interpreted by me - wbc mildly elev. Alk phos and bili also elevated, lipase is normal.   Await CT.  Recheck pt - alert, content, awaiting CT.   CT pending - signed out to Dr Ronnald Nian to CT when resulted, and disposition patient appropriately.      Final Clinical Impression(s) / ED Diagnoses Final diagnoses:  None    Rx / DC Orders ED Discharge Orders    None       Lajean Saver, MD 02/16/20 1611

## 2020-02-16 NOTE — ED Provider Notes (Signed)
CT scan concerning for liver metastasis. No obvious colon mass or mass elsewhere. Patient does have a long history of smoking. Overall has had a chronic ongoing process likely secondary to an oncology process. Overall he is comfortable. I have given him the information to follow-up with oncology. Given Roxicodone prescription for any breakthrough pain as needed. Understands return precautions and discharged in ED in good condition.  This chart was dictated using voice recognition software.  Despite best efforts to proofread,  errors can occur which can change the documentation meaning.    Lennice Sites, DO 02/16/20 1730

## 2020-02-17 ENCOUNTER — Telehealth: Payer: Self-pay | Admitting: Hematology and Oncology

## 2020-02-17 NOTE — Telephone Encounter (Signed)
Received a new pt referral from the hospital for liver mets. [name] has been cld and scheduled to see Dr. Lorenso Courier on 6/21 at 2pm. Pt aware to arrive 15 minutes early.

## 2020-02-23 ENCOUNTER — Inpatient Hospital Stay: Payer: Medicaid Other

## 2020-02-23 ENCOUNTER — Other Ambulatory Visit: Payer: Self-pay

## 2020-02-23 ENCOUNTER — Inpatient Hospital Stay: Payer: Medicaid Other | Attending: Hematology and Oncology | Admitting: Hematology and Oncology

## 2020-02-23 ENCOUNTER — Encounter: Payer: Self-pay | Admitting: Hematology and Oncology

## 2020-02-23 VITALS — BP 167/96 | HR 82 | Temp 97.7°F | Resp 18 | Ht 74.0 in | Wt 222.1 lb

## 2020-02-23 DIAGNOSIS — R748 Abnormal levels of other serum enzymes: Secondary | ICD-10-CM

## 2020-02-23 DIAGNOSIS — R1084 Generalized abdominal pain: Secondary | ICD-10-CM | POA: Insufficient documentation

## 2020-02-23 DIAGNOSIS — K769 Liver disease, unspecified: Secondary | ICD-10-CM | POA: Insufficient documentation

## 2020-02-23 DIAGNOSIS — R590 Localized enlarged lymph nodes: Secondary | ICD-10-CM | POA: Diagnosis not present

## 2020-02-23 DIAGNOSIS — E119 Type 2 diabetes mellitus without complications: Secondary | ICD-10-CM | POA: Insufficient documentation

## 2020-02-23 DIAGNOSIS — D72829 Elevated white blood cell count, unspecified: Secondary | ICD-10-CM | POA: Diagnosis not present

## 2020-02-23 DIAGNOSIS — I10 Essential (primary) hypertension: Secondary | ICD-10-CM | POA: Insufficient documentation

## 2020-02-23 DIAGNOSIS — D72825 Bandemia: Secondary | ICD-10-CM

## 2020-02-23 DIAGNOSIS — R160 Hepatomegaly, not elsewhere classified: Secondary | ICD-10-CM

## 2020-02-23 LAB — CBC WITH DIFFERENTIAL (CANCER CENTER ONLY)
Abs Immature Granulocytes: 0.09 10*3/uL — ABNORMAL HIGH (ref 0.00–0.07)
Basophils Absolute: 0 10*3/uL (ref 0.0–0.1)
Basophils Relative: 0 %
Eosinophils Absolute: 0.1 10*3/uL (ref 0.0–0.5)
Eosinophils Relative: 1 %
HCT: 36.3 % — ABNORMAL LOW (ref 39.0–52.0)
Hemoglobin: 12.1 g/dL — ABNORMAL LOW (ref 13.0–17.0)
Immature Granulocytes: 1 %
Lymphocytes Relative: 11 %
Lymphs Abs: 1.6 10*3/uL (ref 0.7–4.0)
MCH: 27.1 pg (ref 26.0–34.0)
MCHC: 33.3 g/dL (ref 30.0–36.0)
MCV: 81.2 fL (ref 80.0–100.0)
Monocytes Absolute: 0.9 10*3/uL (ref 0.1–1.0)
Monocytes Relative: 6 %
Neutro Abs: 11.8 10*3/uL — ABNORMAL HIGH (ref 1.7–7.7)
Neutrophils Relative %: 81 %
Platelet Count: 288 10*3/uL (ref 150–400)
RBC: 4.47 MIL/uL (ref 4.22–5.81)
RDW: 16 % — ABNORMAL HIGH (ref 11.5–15.5)
WBC Count: 14.5 10*3/uL — ABNORMAL HIGH (ref 4.0–10.5)
nRBC: 0 % (ref 0.0–0.2)

## 2020-02-23 LAB — CMP (CANCER CENTER ONLY)
ALT: 78 U/L — ABNORMAL HIGH (ref 0–44)
AST: 103 U/L — ABNORMAL HIGH (ref 15–41)
Albumin: 2.9 g/dL — ABNORMAL LOW (ref 3.5–5.0)
Alkaline Phosphatase: 617 U/L — ABNORMAL HIGH (ref 38–126)
Anion gap: 12 (ref 5–15)
BUN: 17 mg/dL (ref 8–23)
CO2: 28 mmol/L (ref 22–32)
Calcium: 9.6 mg/dL (ref 8.9–10.3)
Chloride: 101 mmol/L (ref 98–111)
Creatinine: 1.77 mg/dL — ABNORMAL HIGH (ref 0.61–1.24)
GFR, Est AFR Am: 47 mL/min — ABNORMAL LOW (ref >60)
GFR, Estimated: 40 mL/min — ABNORMAL LOW (ref >60)
Glucose, Bld: 193 mg/dL — ABNORMAL HIGH (ref 70–99)
Potassium: 4.5 mmol/L (ref 3.5–5.1)
Sodium: 141 mmol/L (ref 135–145)
Total Bilirubin: 3.3 mg/dL — ABNORMAL HIGH (ref 0.3–1.2)
Total Protein: 7.8 g/dL (ref 6.5–8.1)

## 2020-02-23 LAB — LACTATE DEHYDROGENASE: LDH: 360 U/L — ABNORMAL HIGH (ref 98–192)

## 2020-02-23 NOTE — Progress Notes (Signed)
[facility] Telephone:(336) [phone]   Fax:([phone]) [place] NOTE  Patient Care Team: Dettinger, Fransisca Kaufmann, MD as PCP - General (Family Medicine)  Hematological/Oncological History # Liver Lesions Concerning for Metastatic Disease 1) 02/16/2020: presented to the emergency department with diffuse abdominal pain. CT abdomen showed multiple irregular low density abnormalities are noted throughout the liver concerning for metastatic disease. Additionally noted to have mildly enlarged retroperitoneal lymph nodes are noted which may be inflammatory or metastatic in origin 2) 02/23/2020: establish care with Dr. Lorenso Courier   CHIEF COMPLAINTS/PURPOSE OF CONSULTATION:  "Liver lesions "  HISTORY OF PRESENTING ILLNESS:  [name] 64 y.o. male with medical history significant for hypertension, diabetes, and illicit drug use who presents for evaluation of newly diagnosed liver lesions concerning for metastatic disease.  On review of the previous records [name] presented to the emergency department on 02/16/2020 with diffuse abdominal pain.  As part of his work-up he underwent a CT abdomen which showed multiple irregular low-density abnormalities noted throughout the liver concerning for metastatic disease.  Additionally he was noted to have mildly enlarged retroperitoneal lymph nodes.  Blood work at the time was concerning for a leukocytosis of 14.7, alkaline phosphatase of 407, AST 52, ALT of 43, and a total bilirubin of 2.3.  Due to concern for these findings he was referred to oncology for further evaluation management.  On exam today [name] notes that he has been losing weight and has had decreased appetite.  He states that there has been 35 pounds of weight loss in the last 3 months and this has been unintentional.  He notes that when he initially presented he was having a dull stabbing pain bilaterally in his abdomen.  He has been having increasing gas and belching.   He denies having any issues with nausea, vomiting or diarrhea.  He also reports he has not noticed any blood in his stool or dark bowel movements.  On further review it appears his blood pressure is elevated today, though he notes he has not taken his blood pressure medication for the last 4 days.  He does note that he has a supply and that he would be to take it, but that due to his abdominal pain he has been avoiding taking it.  He notes that he is an active smoker and smokes approximately 1/2 pack/day but previously smoked up to 1 pack/day.  He notes that he has not had any alcohol to drink for the last 30 years.  He ports that his grandmother passed away of liver cancer later in life and his mother had renal failure and heart disease.  He has no other remarkable family history.  A full 10 point ROS is listed below.  MEDICAL HISTORY:  Past Medical History:  Diagnosis Date  . Diabetes mellitus without complication ([place])     SURGICAL HISTORY: Past Surgical History:  Procedure Laterality Date  . APPENDECTOMY      SOCIAL HISTORY: Social History   Socioeconomic History  . Marital status: Single    Spouse name: Not on file  . Number of children: Not on file  . Years of education: Not on file  . Highest education level: Not on file  Occupational History  . Not on file  Tobacco Use  . Smoking status: Current Every Day Smoker    Packs/day: 0.50    Years: 40.00    Pack years: 20.00    Types: Cigarettes  . Smokeless tobacco: Never Used  Substance and Sexual Activity  . Alcohol use: No  . Drug use: No    Types: Cocaine    Comment: used cocaine 2 months  . Sexual activity: Not Currently    Comment: no partner in 1 year  Other Topics Concern  . Not on file  Social History Narrative  . Not on file   Social Determinants of Health   Financial Resource Strain:   . Difficulty of Paying Living Expenses:   Food Insecurity:   . Worried About Charity fundraiser in the Last Year:   .  Arboriculturist in the Last Year:   Transportation Needs:   . Film/video editor (Medical):   Marland Kitchen Lack of Transportation (Non-Medical):   Physical Activity:   . Days of Exercise per Week:   . Minutes of Exercise per Session:   Stress:   . Feeling of Stress :   Social Connections:   . Frequency of Communication with Friends and Family:   . Frequency of Social Gatherings with Friends and Family:   . Attends Religious Services:   . Active Member of Clubs or Organizations:   . Attends Archivist Meetings:   Marland Kitchen Marital Status:   Intimate Partner Violence:   . Fear of Current or Ex-Partner:   . Emotionally Abused:   Marland Kitchen Physically Abused:   . Sexually Abused:     FAMILY HISTORY: Family History  Problem Relation Age of Onset  . Heart disease Mother   . Hypertension Mother   . Diabetes Mother   . Alcohol abuse Father   . Diabetes Brother   . Hypertension Brother   . Stroke Sister   . Hypertension Sister   . Diabetes Sister     ALLERGIES:  has No Known Allergies.  MEDICATIONS:  Current Outpatient Medications  Medication Sig Dispense Refill  . atorvastatin (LIPITOR) 40 MG tablet Take 1 tablet (40 mg total) by mouth daily at 6 PM. 30 tablet 5  . insulin NPH-regular Human (70-30) 100 UNIT/ML injection Inject 10 Units into the skin.    Marland Kitchen oxyCODONE (ROXICODONE) 5 MG immediate release tablet Take 1 tablet (5 mg total) by mouth every 6 (six) hours as needed for up to 15 doses for severe pain or breakthrough pain. (Patient not taking: Reported on 02/23/2020) 15 tablet 0   No current facility-administered medications for this visit.    REVIEW OF SYSTEMS:   Constitutional: ( - ) fevers, ( - )  chills , ( - ) night sweats Eyes: ( - ) blurriness of vision, ( - ) double vision, ( - ) watery eyes Ears, nose, mouth, throat, and face: ( - ) mucositis, ( - ) sore throat Respiratory: ( - ) cough, ( - ) dyspnea, ( - ) wheezes Cardiovascular: ( - ) palpitation, ( - ) chest  discomfort, ( - ) lower extremity swelling Gastrointestinal:  ( - ) nausea, ( - ) heartburn, ( - ) change in bowel habits Skin: ( - ) abnormal skin rashes Lymphatics: ( - ) new lymphadenopathy, ( - ) easy bruising Neurological: ( - ) numbness, ( - ) tingling, ( - ) new weaknesses Behavioral/Psych: ( - ) mood change, ( - ) new changes  All other systems were reviewed with the patient and are negative.  PHYSICAL EXAMINATION: ECOG PERFORMANCE STATUS: 1 - Symptomatic but completely ambulatory  Vitals:   02/23/20 1429  BP: (!) 167/96  Pulse: 82  Resp: 18  Temp: 97.7 F (36.5  C)  SpO2: 100%   Filed Weights   02/23/20 1429  Weight: 222 lb 1.6 oz (100.7 kg)    GENERAL: well appearing middle aged [place] American male in NAD  SKIN: skin color, texture, turgor are normal, no rashes or significant lesions EYES: conjunctiva are pink and non-injected, sclera clear LUNGS: clear to auscultation and percussion with normal breathing effort HEART: regular rate & rhythm and no murmurs and no lower extremity edema ABDOMEN: soft, non-tender, non-distended, normal bowel sounds. No HSM appreciated.  Musculoskeletal: no cyanosis of digits and no clubbing  PSYCH: alert & oriented x 3, fluent speech NEURO: no focal motor/sensory deficits  LABORATORY DATA:  I have reviewed the data as listed CBC Latest Ref Rng & Units 02/16/2020 09/23/2016 07/07/2013  WBC 4.0 - 10.5 K/uL 14.7(H) 8.2 8.0  Hemoglobin 13.0 - 17.0 g/dL 13.2 14.7 14.6  Hematocrit 39 - 52 % 40.7 41.4 41.7  Platelets 150 - 400 K/uL 284 214 206    CMP Latest Ref Rng & Units 02/16/2020 09/23/2016 09/30/2015  Glucose 70 - 99 mg/dL 167(H) 267(H) 445(>)  BUN 8 - 23 mg/dL 15 24(H) 18  Creatinine 0.61 - 1.24 mg/dL 1.48(H) 1.36(H) 1.32(H)  Sodium 135 - 145 mmol/L 136 134(L) 133(L)  Potassium 3.5 - 5.1 mmol/L 3.5 4.1 5.3(H)  Chloride 98 - 111 mmol/L 100 103 91(L)  CO2 22 - 32 mmol/L 24 23 24   Calcium 8.9 - 10.3 mg/dL 9.2 9.4 10.0  Total Protein  6.5 - 8.1 g/dL 7.4 - 7.6  Total Bilirubin 0.3 - 1.2 mg/dL 2.3(H) - 0.5  Alkaline Phos 38 - 126 U/L 407(H) - 112  AST 15 - 41 U/L 52(H) - 10  ALT 0 - 44 U/L 43 - 11     PATHOLOGY: Biopsy requested, no current pathology to review.   RADIOGRAPHIC STUDIES: CT Abdomen Pelvis W Contrast  Result Date: 02/16/2020 CLINICAL DATA:  Acute generalized abdominal pain. EXAM: CT ABDOMEN AND PELVIS WITH CONTRAST TECHNIQUE: Multidetector CT imaging of the abdomen and pelvis was performed using the standard protocol following bolus administration of intravenous contrast. CONTRAST:  163mL OMNIPAQUE IOHEXOL 300 MG/ML  SOLN COMPARISON:  None. FINDINGS: Lower chest: No acute abnormality. Hepatobiliary: No gallstones or biliary dilatation is noted. Multiple irregular low density abnormalities are noted throughout the liver concerning for metastatic disease. These are most prominently seen in the left hepatic lobe. Pancreas: Unremarkable. No pancreatic ductal dilatation or surrounding inflammatory changes. Spleen: Normal in size without focal abnormality. Adrenals/Urinary Tract: Adrenal glands appear normal. Left renal cyst is noted. No hydronephrosis or renal obstruction is noted. Urinary bladder is unremarkable. Stomach/Bowel: The stomach appears normal. There is no evidence of bowel obstruction or inflammation. Status post appendectomy. Vascular/Lymphatic: Atherosclerosis of abdominal aorta is noted without aneurysm or dissection. Mildly enlarged lymph nodes are noted in the retroperitoneal region which may be inflammatory or metastatic in origin. Reproductive: Moderately enlarged prostate gland is noted. Other: Mild ascites is noted.  No definite hernia is noted. Musculoskeletal: No acute or significant osseous findings. IMPRESSION: 1. Multiple irregular low density abnormalities are noted throughout the liver concerning for metastatic disease. These are most prominently seen in the left hepatic lobe. Further evaluation  with MRI with and without gadolinium administration is recommended. 2. Mildly enlarged retroperitoneal lymph nodes are noted which may be inflammatory or metastatic in origin. 3. Mild ascites is noted. 4. Moderately enlarged prostate gland. Aortic Atherosclerosis (ICD10-I70.0). Electronically Signed   By: Marijo Conception M.D.   On: 02/16/2020  16:42    ASSESSMENT & PLAN [name] 64 y.o. male with medical history significant for hypertension, diabetes, and illicit drug use who presents for evaluation of newly diagnosed liver lesions concerning for metastatic disease.  After review the labs, review the imaging, discussion with the patient the findings do appear consistent with metastatic disease to the liver.  The differential was broad and includes colon cancer, lung cancer, prostate cancer, or liver primary (such as a cholangiocarcinoma).  Today we will complete the staging work-up with a CT of the chest and will order tumor markers for baseline levels and to help in the diagnostic process.  The most important step will be to obtain an ultrasound-guided biopsy of one of the liver lesions in order to confirm the diagnosis.  Overall the patient's pain and symptoms are manageable.  He can use ibuprofen with caution for his current pain control.  We may need to consider escalation to tramadol for the next step of pain management (I would avoid Tylenol given his clear liver dysfunction).  We will have the patient return to clinic in 3 weeks time or sooner if we obtain biopsy results before that time.  # Liver Lesions Concerning for Metastatic Disease --today will repeat CBC, CMP, and LDH --will collect tumor markers of CEA, CA 19-9, AFP, and PSA --will request US guided biopsy of one of the liver lesions --will order CT scan of the chest to complete staging --RTC in 3 weeks or sooner if we have biopsy results available.   #Leukocytosis --potentially leukocytosis of malignancy given his high tumor  burden --will recheck CBC today   Orders Placed This Encounter  Procedures  . US Guided Needle Placement    Standing Status:   Future    Standing Expiration Date:   02/22/2021    Order Specific Question:   Reason for Exam (SYMPTOM  OR DIAGNOSIS REQUIRED)    Answer:   Liver lesions, concern for metastatic disease. Requesting biopsy    Order Specific Question:   Preferred imaging location?    Answer:   [facility]  . CBC with Differential ([place] Only)    Standing Status:   Future    Standing Expiration Date:   02/22/2021  . CMP ([place] only)    Standing Status:   Future    Standing Expiration Date:   02/22/2021  . Lactate dehydrogenase (LDH)    Standing Status:   Future    Standing Expiration Date:   02/22/2021  . CA 19.9    Standing Status:   Future    Standing Expiration Date:   02/22/2021  . AFP tumor marker    Standing Status:   Future    Standing Expiration Date:   02/22/2021  . CEA (IN HOUSE-[facility])    Standing Status:   Future    Standing Expiration Date:   02/22/2021  . Prostate-Specific AG, Serum    Standing Status:   Future    Standing Expiration Date:   02/22/2021    All questions were answered. The patient knows to call the clinic with any problems, questions or concerns.  A total of more than 60 minutes were spent on this encounter and over half of that time was spent on counseling and coordination of care as outlined above.   Ledell Peoples, MD Department of Hematology/Oncology [place] at [facility] Phone: [phone] Pager: [phone] Email: [email or web address]  02/23/2020 3:11 PM

## 2020-02-24 ENCOUNTER — Encounter: Payer: Self-pay | Admitting: *Deleted

## 2020-02-24 ENCOUNTER — Telehealth: Payer: Self-pay | Admitting: Hematology and Oncology

## 2020-02-24 LAB — AFP TUMOR MARKER: AFP, Serum, Tumor Marker: 3.8 ng/mL (ref 0.0–8.3)

## 2020-02-24 LAB — PROSTATE-SPECIFIC AG, SERUM (LABCORP): Prostate Specific Ag, Serum: 1.1 ng/mL (ref 0.0–4.0)

## 2020-02-24 LAB — CEA (IN HOUSE-CHCC): CEA (CHCC-In House): 1270.46 ng/mL — ABNORMAL HIGH (ref 0.00–5.00)

## 2020-02-24 LAB — CANCER ANTIGEN 19-9: CA 19-9: 21264 U/mL — ABNORMAL HIGH (ref 0–35)

## 2020-02-24 NOTE — Progress Notes (Signed)
[name]  CSW spoke with [employer] with [facility].  She as screened the patient for Medicaid and will begin the application process.  Patient also agreed to a servant center referral; which will also be completed by [facility].   Johnnye Lana, MSW, LCSW, OSW-C Clinical Social Worker [facility] [phone]

## 2020-02-24 NOTE — Telephone Encounter (Signed)
Scheduled per los. Called and left msg. Mailed printout  °

## 2020-02-24 NOTE — Progress Notes (Signed)
[name]  Clinical Social Alvarez received referral from medical oncology for financial concerns.  CSW contacted patient at home to offer support and assess for needs.  Patient stated he was currently uninsured, and does not recall speaking with anyone about medicaid or disability since he was in the ED.  CSW contacted [facility] [employer] to determine if patient has been screened.  [facility] had not received referral for patient as of today, but are going to begin the screening process and will reach out to patient regarding a medicaid application.  CSW contacted patient at home to provide update and discuss a servant center referral.  [name] left a voicemail encouraging patient to return call.  [name], MSW, LCSW, OSW-C Clinical Social Worker [facility] [phone]

## 2020-02-25 ENCOUNTER — Encounter (HOSPITAL_COMMUNITY): Payer: Self-pay

## 2020-02-25 ENCOUNTER — Telehealth (HOSPITAL_COMMUNITY): Payer: Self-pay

## 2020-02-25 NOTE — Progress Notes (Signed)
[name] Male, 64 y.o., [birth year] MRN:  [id number] Phone:  [phone] [name]) PCP:  Dettinger, Fransisca Kaufmann, MD Primary Cvg:  None Next Appt With Radiology ([facility] 1) 03/02/2020 at 3:00 PM  RE: Biopsy Received: Yesterday Message Details  Arne Cleveland, MD  Ernestene Mention   [place] core liver lesion  May need para 1st   DD[facility]   Previous Messages  ----- Message -----  From: Lenore Cordia  Sent: 02/24/2020  3:39 PM EDT  To: Ir Procedure Requests  Subject: Biopsy                      Procedure Requested: [place] Bx Liver    Reason for Procedure: Liver lesions, concern for metastatic disease. Requesting biopsy    Provider Requesting: Orson Slick  Provider Telephone: [phone]    Other Info: Rad exam in Epic

## 2020-03-02 ENCOUNTER — Other Ambulatory Visit: Payer: Self-pay

## 2020-03-02 ENCOUNTER — Encounter (HOSPITAL_COMMUNITY): Payer: Self-pay

## 2020-03-02 ENCOUNTER — Ambulatory Visit (HOSPITAL_COMMUNITY): Payer: Self-pay

## 2020-03-02 ENCOUNTER — Ambulatory Visit (HOSPITAL_COMMUNITY)
Admission: RE | Admit: 2020-03-02 | Discharge: 2020-03-02 | Disposition: A | Discharge status: Home / Self Care | Payer: Medicaid Other | Source: Ambulatory Visit | Attending: Hematology and Oncology | Admitting: Hematology and Oncology

## 2020-03-02 DIAGNOSIS — R160 Hepatomegaly, not elsewhere classified: Secondary | ICD-10-CM | POA: Insufficient documentation

## 2020-03-02 MED ORDER — IOHEXOL 300 MG/ML  SOLN
75.0000 mL | Freq: Once | INTRAMUSCULAR | Status: AC | PRN
  Administered 2020-03-02: 75 mL via INTRAVENOUS

## 2020-03-02 MED ORDER — SODIUM CHLORIDE (PF) 0.9 % IJ SOLN
INTRAMUSCULAR | Status: AC
  Filled 2020-03-02: qty 50

## 2020-03-04 ENCOUNTER — Other Ambulatory Visit: Payer: Self-pay | Admitting: Radiology

## 2020-03-04 ENCOUNTER — Other Ambulatory Visit: Payer: Self-pay | Admitting: Student

## 2020-03-05 ENCOUNTER — Other Ambulatory Visit: Payer: Self-pay

## 2020-03-05 ENCOUNTER — Ambulatory Visit (HOSPITAL_COMMUNITY)
Admission: RE | Admit: 2020-03-05 | Discharge: 2020-03-05 | Disposition: A | Discharge status: Home / Self Care | Payer: Self-pay | Source: Ambulatory Visit | Attending: Hematology and Oncology | Admitting: Hematology and Oncology

## 2020-03-05 ENCOUNTER — Encounter (HOSPITAL_COMMUNITY): Payer: Self-pay

## 2020-03-05 ENCOUNTER — Other Ambulatory Visit (HOSPITAL_COMMUNITY): Payer: Self-pay | Admitting: Diagnostic Radiology

## 2020-03-05 DIAGNOSIS — R160 Hepatomegaly, not elsewhere classified: Secondary | ICD-10-CM

## 2020-03-05 DIAGNOSIS — I10 Essential (primary) hypertension: Secondary | ICD-10-CM | POA: Insufficient documentation

## 2020-03-05 DIAGNOSIS — R188 Other ascites: Secondary | ICD-10-CM

## 2020-03-05 DIAGNOSIS — Z8249 Family history of ischemic heart disease and other diseases of the circulatory system: Secondary | ICD-10-CM | POA: Insufficient documentation

## 2020-03-05 DIAGNOSIS — C220 Liver cell carcinoma: Secondary | ICD-10-CM | POA: Insufficient documentation

## 2020-03-05 DIAGNOSIS — E119 Type 2 diabetes mellitus without complications: Secondary | ICD-10-CM | POA: Insufficient documentation

## 2020-03-05 DIAGNOSIS — R590 Localized enlarged lymph nodes: Secondary | ICD-10-CM | POA: Insufficient documentation

## 2020-03-05 DIAGNOSIS — N400 Enlarged prostate without lower urinary tract symptoms: Secondary | ICD-10-CM | POA: Insufficient documentation

## 2020-03-05 DIAGNOSIS — F17210 Nicotine dependence, cigarettes, uncomplicated: Secondary | ICD-10-CM | POA: Insufficient documentation

## 2020-03-05 DIAGNOSIS — Z794 Long term (current) use of insulin: Secondary | ICD-10-CM | POA: Insufficient documentation

## 2020-03-05 DIAGNOSIS — R911 Solitary pulmonary nodule: Secondary | ICD-10-CM | POA: Insufficient documentation

## 2020-03-05 DIAGNOSIS — R1084 Generalized abdominal pain: Secondary | ICD-10-CM | POA: Insufficient documentation

## 2020-03-05 DIAGNOSIS — N281 Cyst of kidney, acquired: Secondary | ICD-10-CM | POA: Insufficient documentation

## 2020-03-05 DIAGNOSIS — Z79899 Other long term (current) drug therapy: Secondary | ICD-10-CM | POA: Insufficient documentation

## 2020-03-05 LAB — COMPREHENSIVE METABOLIC PANEL
ALT: 74 U/L — ABNORMAL HIGH (ref 0–44)
AST: 111 U/L — ABNORMAL HIGH (ref 15–41)
Albumin: 2.7 g/dL — ABNORMAL LOW (ref 3.5–5.0)
Alkaline Phosphatase: 602 U/L — ABNORMAL HIGH (ref 38–126)
Anion gap: 16 — ABNORMAL HIGH (ref 5–15)
BUN: 28 mg/dL — ABNORMAL HIGH (ref 8–23)
CO2: 24 mmol/L (ref 22–32)
Calcium: 8.9 mg/dL (ref 8.9–10.3)
Chloride: 93 mmol/L — ABNORMAL LOW (ref 98–111)
Creatinine, Ser: 1.65 mg/dL — ABNORMAL HIGH (ref 0.61–1.24)
GFR calc Af Amer: 51 mL/min — ABNORMAL LOW (ref >60)
GFR calc non Af Amer: 44 mL/min — ABNORMAL LOW (ref >60)
Glucose, Bld: 177 mg/dL — ABNORMAL HIGH (ref 70–99)
Potassium: 3.8 mmol/L (ref 3.5–5.1)
Sodium: 133 mmol/L — ABNORMAL LOW (ref 135–145)
Total Bilirubin: 8.6 mg/dL — ABNORMAL HIGH (ref 0.3–1.2)
Total Protein: 7.5 g/dL (ref 6.5–8.1)

## 2020-03-05 LAB — CBC
HCT: 37.3 % — ABNORMAL LOW (ref 39.0–52.0)
Hemoglobin: 12.7 g/dL — ABNORMAL LOW (ref 13.0–17.0)
MCH: 27.4 pg (ref 26.0–34.0)
MCHC: 34 g/dL (ref 30.0–36.0)
MCV: 80.6 fL (ref 80.0–100.0)
Platelets: 401 10*3/uL — ABNORMAL HIGH (ref 150–400)
RBC: 4.63 MIL/uL (ref 4.22–5.81)
RDW: 17.5 % — ABNORMAL HIGH (ref 11.5–15.5)
WBC: 17.7 10*3/uL — ABNORMAL HIGH (ref 4.0–10.5)
nRBC: 0 % (ref 0.0–0.2)

## 2020-03-05 LAB — GLUCOSE, CAPILLARY: Glucose-Capillary: 165 mg/dL — ABNORMAL HIGH (ref 70–99)

## 2020-03-05 LAB — PROTIME-INR
INR: 1 (ref 0.8–1.2)
Prothrombin Time: 12.9 seconds (ref 11.4–15.2)

## 2020-03-05 MED ORDER — GELATIN ABSORBABLE 12-7 MM EX MISC
CUTANEOUS | Status: AC
  Filled 2020-03-05: qty 1

## 2020-03-05 MED ORDER — LIDOCAINE HCL 1 % IJ SOLN
INTRAMUSCULAR | Status: AC
  Filled 2020-03-05: qty 20

## 2020-03-05 MED ORDER — LIDOCAINE HCL (PF) 1 % IJ SOLN
INTRAMUSCULAR | Status: AC | PRN
  Administered 2020-03-05 (×2): 5 mL

## 2020-03-05 MED ORDER — SODIUM CHLORIDE 0.9 % IV SOLN: INTRAVENOUS | Status: DC

## 2020-03-05 MED ORDER — MIDAZOLAM HCL 2 MG/2ML IJ SOLN
INTRAMUSCULAR | Status: AC
  Filled 2020-03-05: qty 4

## 2020-03-05 MED ORDER — FENTANYL CITRATE (PF) 100 MCG/2ML IJ SOLN
INTRAMUSCULAR | Status: AC | PRN
  Administered 2020-03-05: 50 ug via INTRAVENOUS

## 2020-03-05 MED ORDER — MIDAZOLAM HCL 2 MG/2ML IJ SOLN
INTRAMUSCULAR | Status: AC | PRN
  Administered 2020-03-05: 1 mg via INTRAVENOUS

## 2020-03-05 MED ORDER — FENTANYL CITRATE (PF) 100 MCG/2ML IJ SOLN
INTRAMUSCULAR | Status: AC
  Filled 2020-03-05: qty 2

## 2020-03-05 NOTE — Discharge Instructions (Signed)
Please call Interventional Radiology clinic [phone] with any questions or concerns.  You may remove your dressing and shower tomorrow.   Liver Biopsy, Care After These instructions give you information on caring for yourself after your procedure. Your doctor may also give you more specific instructions. Call your doctor if you have any problems or questions after your procedure. What can I expect after the procedure? After the procedure, it is common to have:  Pain and soreness where the biopsy was done.  Bruising around the area where the biopsy was done.  Sleepiness and be tired for a few days. Follow these instructions at home: Medicines  Take over-the-counter and prescription medicines only as told by your doctor.  If you were prescribed an antibiotic medicine, take it as told by your doctor. Do not stop taking the antibiotic even if you start to feel better.  Do not take medicines such as aspirin and ibuprofen. These medicines can thin your blood. Do not take these medicines unless your doctor tells you to take them.  If you are taking prescription pain medicine, take actions to prevent or treat constipation. Your doctor may recommend that you: ? Drink enough fluid to keep your pee (urine) clear or pale yellow. ? Take over-the-counter or prescription medicines. ? Eat foods that are high in fiber, such as fresh fruits and vegetables, whole grains, and beans. ? Limit foods that are high in fat and processed sugars, such as fried and sweet foods. Caring for your cut  Follow instructions from your doctor about how to take care of your cuts from surgery (incisions). Make sure you: ? Wash your hands with soap and water before you change your bandage (dressing). If you cannot use soap and water, use hand sanitizer. ? Change your bandage as told by your doctor. ? Leave stitches (sutures), skin glue, or skin tape (adhesive) strips in place. They may need to stay in place for 2  weeks or longer. If tape strips get loose and curl up, you may trim the loose edges. Do not remove tape strips completely unless your doctor says it is okay.  Check your cuts every day for signs of infection. Check for: ? Redness, swelling, or more pain. ? Fluid or blood. ? Pus or a bad smell. ? Warmth.  Do not take baths, swim, or use a hot tub until your doctor says it is okay to do so. Activity   Rest at home for 1-2 days or as told by your doctor. ? Avoid sitting for a long time without moving. Get up to take short walks every 1-2 hours.  Return to your normal activities as told by your doctor. Ask what activities are safe for you.  Do not do these things in the first 24 hours: ? Drive. ? Use machinery. ? Take a bath or shower.  Do not lift more than 10 pounds (4.5 kg) or play contact sports for the first 2 weeks. General instructions   Do not drink alcohol in the first week after the procedure.  Have someone stay with you for at least 24 hours after the procedure.  Get your test results. Ask your doctor or the department that is doing the test: ? When will my results be ready? ? How will I get my results? ? What are my treatment options? ? What other tests do I need? ? What are my next steps?  Keep all follow-up visits as told by your doctor. This is important. Contact a doctor  if:  A cut bleeds and leaves more than just a small spot of blood.  A cut is red, puffs up (swells), or hurts more than before.  Fluid or something else comes from a cut.  A cut smells bad.  You have a fever or chills. Get help right away if:  You have swelling, bloating, or pain in your belly (abdomen).  You get dizzy or faint.  You have a rash.  You feel sick to your stomach (nauseous) or throw up (vomit).  You have trouble breathing, feel short of breath, or feel faint.  Your chest hurts.  You have problems talking or seeing.  You have trouble with your balance or moving  your arms or legs. Summary  After the procedure, it is common to have pain, soreness, bruising, and tiredness.  Your doctor will tell you how to take care of yourself at home. Change your bandage, take your medicines, and limit your activities as told by your doctor.  Call your doctor if you have symptoms of infection. Get help right away if your belly swells, your cut bleeds a lot, or you have trouble talking or breathing. This information is not intended to replace advice given to you by your health care provider. Make sure you discuss any questions you have with your health care provider. Document Revised: 08/31/2017 Document Reviewed: 08/31/2017 Elsevier Patient Education  [place].   Paracentesis, Care After    1 Liter fluid removed  This sheet gives you information about how to care for yourself after your procedure. Your health care provider may also give you more specific instructions. If you have problems or questions, contact your health care provider. What can I expect after the procedure? After the procedure, it is common to have a small amount of clear fluid coming from the puncture site. Follow these instructions at home: Puncture site care   Follow instructions from your health care provider about how to take care of your puncture site. Make sure you: ? Wash your hands with soap and water before and after you change your bandage (dressing). If soap and water are not available, use hand sanitizer. ? Change your dressing as told by your health care provider.  Check your puncture area every day for signs of infection. Check for: ? Redness, swelling, or pain. ? More fluid or blood. ? Warmth. ? Pus or a bad smell. General instructions  Return to your normal activities as told by your health care provider. Ask your health care provider what activities are safe for you.  Take over-the-counter and prescription medicines only as told by your health care  provider.  Do not take baths, swim, or use a hot tub until your health care provider approves. Ask your health care provider if you may take showers. You may only be allowed to take sponge baths.  Keep all follow-up visits as told by your health care provider. This is important. Contact a health care provider if:  You have redness, swelling, or pain at your puncture site.  You have more fluid or blood coming from your puncture site.  Your puncture site feels warm to the touch.  You have pus or a bad smell coming from your puncture site.  You have a fever. Get help right away if:  You have chest pain or shortness of breath.  You develop increasing pain, discomfort, or swelling in your abdomen.  You feel dizzy or light-headed or you faint. Summary  After the procedure,  it is common to have a small amount of clear fluid coming from the puncture site.  Follow instructions from your health care provider about how to take care of your puncture site.  Check your puncture area every day signs of infection.  Keep all follow-up visits as told by your health care provider. This information is not intended to replace advice given to you by your health care provider. Make sure you discuss any questions you have with your health care provider. Document Revised: 03/04/2019 Document Reviewed: 06/11/2018 Elsevier Patient Education  [place].    Moderate Conscious Sedation, Adult, Care After These instructions provide you with information about caring for yourself after your procedure. Your health care provider may also give you more specific instructions. Your treatment has been planned according to current medical practices, but problems sometimes occur. Call your health care provider if you have any problems or questions after your procedure. What can I expect after the procedure? After your procedure, it is common:  To feel sleepy for several hours.  To feel clumsy and have  poor balance for several hours.  To have poor judgment for several hours.  To vomit if you eat too soon. Follow these instructions at home: For at least 24 hours after the procedure:   Do not: ? Participate in activities where you could fall or become injured. ? Drive. ? Use heavy machinery. ? Drink alcohol. ? Take sleeping pills or medicines that cause drowsiness. ? Make important decisions or sign legal documents. ? Take care of children on your own.  Rest. Eating and drinking  Follow the diet recommended by your health care provider.  If you vomit: ? Drink water, juice, or soup when you can drink without vomiting. ? Make sure you have little or no nausea before eating solid foods. General instructions  Have a responsible adult stay with you until you are awake and alert.  Take over-the-counter and prescription medicines only as told by your health care provider.  If you smoke, do not smoke without supervision.  Keep all follow-up visits as told by your health care provider. This is important. Contact a health care provider if:  You keep feeling nauseous or you keep vomiting.  You feel light-headed.  You develop a rash.  You have a fever. Get help right away if:  You have trouble breathing. This information is not intended to replace advice given to you by your health care provider. Make sure you discuss any questions you have with your health care provider. Document Revised: 08/03/2017 Document Reviewed: 12/11/2015 Elsevier Patient Education  2020 [employer].

## 2020-03-05 NOTE — Procedures (Signed)
Interventional Radiology Procedure:   Indications: Liver lesions and elevated liver enzymes  Procedure:  US guided paracentesis and US guided liver lesion biopsy  Findings: Perihepatic ascites, removed 1 liter of yellow fluid.  Core biopsies obtained from right hepatic lesion.  Gelfoam injected along needle tract, no immediate bleeding or hematoma formation.   Complications: None     EBL: less than 20 ml  Plan: Bedrest 3 hours.  Discharge to home if asymptomatic and no signs of bleeding.    Eldena Dede R. Anselm Pancoast, MD  Pager: [phone]

## 2020-03-05 NOTE — Consult Note (Addendum)
Chief Complaint: Patient was seen in consultation today for image guided liver lesion biopsy and possible paracentesis.  Referring Physician(s): Dorsey,John T IV  Supervising Physician: Markus Daft  Patient Status: [facility] - Out-pt  History of Present Illness: [name] is a 64 y.o. male smoker with past medical history significant for hypertension, diabetes and illicit drug use who was recently evaluated for diffuse abdominal pain, weight loss. Subsequent imaging revealed multiple liver lesions concerning for metastatic disease along with mildly enlarged retroperitoneal lymph nodes, mild ascites, mildly enlarged prostate gland, small right upper lobe pulmonary nodule.  Both CA 19-9 and CEA are markedly elevated.  He has no prior history of cancer and presents today for image guided liver lesion biopsy/possible paracentesis for further evaluation.  Past Medical History:  Diagnosis Date  . Diabetes mellitus without complication [facility])     Past Surgical History:  Procedure Laterality Date  . APPENDECTOMY      Allergies: Patient has no known allergies.  Medications: Prior to Admission medications   Medication Sig Start Date End Date Taking? Authorizing Provider  atorvastatin (LIPITOR) 40 MG tablet Take 1 tablet (40 mg total) by mouth daily at 6 PM. 11/01/15   Dettinger, Fransisca Kaufmann, MD  insulin NPH-regular Human (70-30) 100 UNIT/ML injection Inject 10 Units into the skin.    [provider]  oxyCODONE (ROXICODONE) 5 MG immediate release tablet Take 1 tablet (5 mg total) by mouth every 6 (six) hours as needed for up to 15 doses for severe pain or breakthrough pain. Patient not taking: Reported on 02/23/2020 02/16/20   Lennice Sites, DO     Family History  Problem Relation Age of Onset  . Heart disease Mother   . Hypertension Mother   . Diabetes Mother   . Alcohol abuse Father   . Diabetes Brother   . Hypertension Brother   . Stroke Sister   . Hypertension Sister     . Diabetes Sister     Social History   Socioeconomic History  . Marital status: Single    Spouse name: Not on file  . Number of children: Not on file  . Years of education: Not on file  . Highest education level: Not on file  Occupational History  . Not on file  Tobacco Use  . Smoking status: Current Every Day Smoker    Packs/day: 0.50    Years: 40.00    Pack years: 20.00    Types: Cigarettes  . Smokeless tobacco: Never Used  Substance and Sexual Activity  . Alcohol use: No  . Drug use: No    Types: Cocaine    Comment: used cocaine 2 months  . Sexual activity: Not Currently    Comment: no partner in 1 year  Other Topics Concern  . Not on file  Social History Narrative  . Not on file   Social Determinants of Health   Financial Resource Strain:   . Difficulty of Paying Living Expenses:   Food Insecurity:   . Worried About Charity fundraiser in the Last Year:   . Arboriculturist in the Last Year:   Transportation Needs:   . Film/video editor (Medical):   Marland Kitchen Lack of Transportation (Non-Medical):   Physical Activity:   . Days of Exercise per Week:   . Minutes of Exercise per Session:   Stress:   . Feeling of Stress :   Social Connections:   . Frequency of Communication with Friends and Family:   .  Frequency of Social Gatherings with Friends and Family:   . Attends Religious Services:   . Active Member of Clubs or Organizations:   . Attends Archivist Meetings:   Marland Kitchen Marital Status:       Review of Systems fever, headache, chest pain, dyspnea, cough, worsening abdominal pain, back pain, nausea, vomiting or bleeding; he does have weight loss, intermittent abdominal discomfort and hiccups  Vital Signs: Blood pressure 132/92, heart rate 81, respiration 18, temp 97.8, O2 sat 100% room air  Physical Exam awake, alert.  Chest clear to auscultation bilaterally.  Heart with regular rate and rhythm.  Abdomen soft, positive bowel sounds, currently  nontender.  Bilateral lower extremity edema noted, more so on the left.  Imaging: CT Chest W Contrast  Result Date: 03/02/2020 CLINICAL DATA:  Indeterminate hepatic lesion identified on CT. Concern for malignancy. EXAM: CT CHEST WITH CONTRAST TECHNIQUE: Multidetector CT imaging of the chest was performed during intravenous contrast administration. CONTRAST:  93mL OMNIPAQUE IOHEXOL 300 MG/ML  SOLN COMPARISON:  CT 02/16/2020 FINDINGS: Cardiovascular: No significant vascular findings. Normal heart size. No pericardial effusion. Mediastinum/Nodes: No axillary supraclavicular adenopathy no mediastinal hilar adenopathy no pericardial effusion. Esophagus normal. Lungs/Pleura: Small RIGHT upper lobe pulmonary nodule measures 4 mm (image 45/series 7). Mild band of atelectasis in the RIGHT lower lobe. No LEFT lung nodularity. Upper Abdomen: Lesions described within the liver on comparison CT are less conspicuous on chest CT. Liver has nodular contour. Small amount of free fluid. Musculoskeletal: No aggressive osseous lesion. Degenerative osteophytosis of the spine. IMPRESSION: 1. No evidence of primary malignancy in the thorax. 2. Small RIGHT upper lobe pulmonary nodule. No follow-up needed if patient is low-risk. Non-contrast chest CT can be considered in 12 months if patient is high-risk. This recommendation follows the consensus statement: Guidelines for Management of Incidental Pulmonary Nodules Detected on CT Images: From the Fleischner Society 2017; Radiology 2017; 284:228-243. 3. Hepatic lesions less well defined on chest CT. Small intraperitoneal free fluid. Electronically Signed   By: Suzy Bouchard M.D.   On: 03/02/2020 08:41   CT Abdomen Pelvis W Contrast  Result Date: 02/16/2020 CLINICAL DATA:  Acute generalized abdominal pain. EXAM: CT ABDOMEN AND PELVIS WITH CONTRAST TECHNIQUE: Multidetector CT imaging of the abdomen and pelvis was performed using the standard protocol following bolus administration  of intravenous contrast. CONTRAST:  113mL OMNIPAQUE IOHEXOL 300 MG/ML  SOLN COMPARISON:  None. FINDINGS: Lower chest: No acute abnormality. Hepatobiliary: No gallstones or biliary dilatation is noted. Multiple irregular low density abnormalities are noted throughout the liver concerning for metastatic disease. These are most prominently seen in the left hepatic lobe. Pancreas: Unremarkable. No pancreatic ductal dilatation or surrounding inflammatory changes. Spleen: Normal in size without focal abnormality. Adrenals/Urinary Tract: Adrenal glands appear normal. Left renal cyst is noted. No hydronephrosis or renal obstruction is noted. Urinary bladder is unremarkable. Stomach/Bowel: The stomach appears normal. There is no evidence of bowel obstruction or inflammation. Status post appendectomy. Vascular/Lymphatic: Atherosclerosis of abdominal aorta is noted without aneurysm or dissection. Mildly enlarged lymph nodes are noted in the retroperitoneal region which may be inflammatory or metastatic in origin. Reproductive: Moderately enlarged prostate gland is noted. Other: Mild ascites is noted.  No definite hernia is noted. Musculoskeletal: No acute or significant osseous findings. IMPRESSION: 1. Multiple irregular low density abnormalities are noted throughout the liver concerning for metastatic disease. These are most prominently seen in the left hepatic lobe. Further evaluation with MRI with and without gadolinium administration is recommended. 2.  Mildly enlarged retroperitoneal lymph nodes are noted which may be inflammatory or metastatic in origin. 3. Mild ascites is noted. 4. Moderately enlarged prostate gland. Aortic Atherosclerosis (ICD10-I70.0). Electronically Signed   By: Marijo Conception M.D.   On: 02/16/2020 16:42    Labs:  CBC: Recent Labs    02/16/20 1117 02/23/20 1512  WBC 14.7* 14.5*  HGB 13.2 12.1*  HCT 40.7 36.3*  PLT 284 288    COAGS: No results for input(s): INR, APTT in the last 8760  hours.  BMP: Recent Labs    02/16/20 1117 02/23/20 1512  NA 136 141  K 3.5 4.5  CL 100 101  CO2 24 28  GLUCOSE 167* 193*  BUN 15 17  CALCIUM 9.2 9.6  CREATININE 1.48* 1.77*  GFRNONAA 50* 40*  GFRAA 58* 47*    LIVER FUNCTION TESTS: Recent Labs    02/16/20 1117 02/23/20 1512  BILITOT 2.3* 3.3*  AST 52* 103*  ALT 43 78*  ALKPHOS 407* 617*  PROT 7.4 7.8  ALBUMIN 2.9* 2.9*    TUMOR MARKERS: No results for input(s): AFPTM, CEA, CA199, CHROMGRNA in the last 8760 hours.  Assessment and Plan:  64 y.o. male smoker with past medical history significant for hypertension, diabetes and illicit drug use who was recently evaluated for diffuse abdominal pain, weight loss. Subsequent imaging revealed multiple liver lesions concerning for metastatic disease along with mildly enlarged retroperitoneal lymph nodes, mild ascites, mildly enlarged prostate gland, small right upper lobe pulmonary nodule.  Both CA 19-9 and CEA are markedly elevated.  He has no prior history of cancer and presents today for image guided liver lesion biopsy/possible paracentesis for further evaluation.Risks and benefits of procedure was discussed with the patient including, but not limited to bleeding, infection, damage to adjacent structures or low yield requiring additional tests.  All of the questions were answered and there is agreement to proceed.  Consent signed and in chart.  LABS PENDING   Thank you for this interesting consult.  I greatly enjoyed meeting Kashmir O Boltz and look forward to participating in their care.  A copy of this report was sent to the requesting provider on this date.  Electronically Signed: D. Rowe Robert, PA-C 03/05/2020, 11:29 AM   I spent a total of 25 minutes  in face to face in clinical consultation, greater than 50% of which was counseling/coordinating care for image guided liver lesion biopsy

## 2020-03-09 LAB — CYTOLOGY - NON PAP

## 2020-03-10 ENCOUNTER — Telehealth: Payer: Self-pay | Admitting: *Deleted

## 2020-03-10 LAB — SURGICAL PATHOLOGY

## 2020-03-10 NOTE — Telephone Encounter (Signed)
-----   Message from Ledell Peoples IV, MD sent at 03/03/2020 11:35 AM EDT ----- Please let Mr. Hickox know that his Chest CT was normal. He is scheduled for biopsy of one of his liver lesions on 03/05/2020 and we will see him back in clinic after that (on 03/15/2020, hopefully with completed biopsy results) .  Colan Neptune  ----- Message ----- From: Buel Ream, Rad Results In Sent: 03/02/2020   8:44 AM EDT To: Orson Slick, MD

## 2020-03-10 NOTE — Telephone Encounter (Signed)
TCT patient to follow up with him after his liver biopsy.  Spoke with him and he states he feels ok-a little sore at times. Reminded him of his appt with Dr. Lorenso Courier on 03/15/20 at 10 am to review pathology report with him. He voiced understanding.  No other questions or concerns.

## 2020-03-15 ENCOUNTER — Encounter (HOSPITAL_COMMUNITY): Payer: Self-pay

## 2020-03-15 ENCOUNTER — Other Ambulatory Visit: Payer: Self-pay | Admitting: Hematology and Oncology

## 2020-03-15 ENCOUNTER — Encounter: Payer: Self-pay | Admitting: Hematology and Oncology

## 2020-03-15 ENCOUNTER — Other Ambulatory Visit: Payer: Self-pay

## 2020-03-15 ENCOUNTER — Inpatient Hospital Stay: Payer: Self-pay

## 2020-03-15 ENCOUNTER — Observation Stay (HOSPITAL_COMMUNITY)
Admission: EM | Admit: 2020-03-15 | Discharge: 2020-03-16 | Disposition: A | Discharge status: Home / Self Care | Payer: Self-pay | Attending: Family Medicine | Admitting: Family Medicine

## 2020-03-15 ENCOUNTER — Inpatient Hospital Stay: Payer: Self-pay | Attending: Hematology and Oncology | Admitting: Hematology and Oncology

## 2020-03-15 ENCOUNTER — Encounter (HOSPITAL_COMMUNITY): Payer: Self-pay | Admitting: Family Medicine

## 2020-03-15 ENCOUNTER — Observation Stay (HOSPITAL_COMMUNITY): Payer: Self-pay

## 2020-03-15 VITALS — BP 133/89 | HR 87 | Temp 97.6°F | Resp 20 | Ht 74.0 in | Wt 215.9 lb

## 2020-03-15 DIAGNOSIS — Z794 Long term (current) use of insulin: Secondary | ICD-10-CM | POA: Insufficient documentation

## 2020-03-15 DIAGNOSIS — C221 Intrahepatic bile duct carcinoma: Secondary | ICD-10-CM

## 2020-03-15 DIAGNOSIS — Z20822 Contact with and (suspected) exposure to covid-19: Secondary | ICD-10-CM | POA: Insufficient documentation

## 2020-03-15 DIAGNOSIS — Z515 Encounter for palliative care: Secondary | ICD-10-CM

## 2020-03-15 DIAGNOSIS — N179 Acute kidney failure, unspecified: Secondary | ICD-10-CM

## 2020-03-15 DIAGNOSIS — D509 Iron deficiency anemia, unspecified: Secondary | ICD-10-CM | POA: Diagnosis present

## 2020-03-15 DIAGNOSIS — R17 Unspecified jaundice: Secondary | ICD-10-CM

## 2020-03-15 DIAGNOSIS — E440 Moderate protein-calorie malnutrition: Secondary | ICD-10-CM | POA: Insufficient documentation

## 2020-03-15 DIAGNOSIS — F17210 Nicotine dependence, cigarettes, uncomplicated: Secondary | ICD-10-CM | POA: Insufficient documentation

## 2020-03-15 DIAGNOSIS — I10 Essential (primary) hypertension: Secondary | ICD-10-CM | POA: Diagnosis present

## 2020-03-15 DIAGNOSIS — Z7189 Other specified counseling: Secondary | ICD-10-CM

## 2020-03-15 DIAGNOSIS — Z79899 Other long term (current) drug therapy: Secondary | ICD-10-CM | POA: Insufficient documentation

## 2020-03-15 DIAGNOSIS — E1121 Type 2 diabetes mellitus with diabetic nephropathy: Secondary | ICD-10-CM | POA: Insufficient documentation

## 2020-03-15 DIAGNOSIS — Z859 Personal history of malignant neoplasm, unspecified: Secondary | ICD-10-CM | POA: Insufficient documentation

## 2020-03-15 DIAGNOSIS — I129 Hypertensive chronic kidney disease with stage 1 through stage 4 chronic kidney disease, or unspecified chronic kidney disease: Secondary | ICD-10-CM | POA: Insufficient documentation

## 2020-03-15 DIAGNOSIS — N183 Chronic kidney disease, stage 3 unspecified: Secondary | ICD-10-CM | POA: Diagnosis present

## 2020-03-15 HISTORY — DX: Malignant (primary) neoplasm, unspecified: C80.1

## 2020-03-15 HISTORY — DX: Essential (primary) hypertension: I10

## 2020-03-15 LAB — CBC WITH DIFFERENTIAL (CANCER CENTER ONLY)
Abs Immature Granulocytes: 0.1 10*3/uL — ABNORMAL HIGH (ref 0.00–0.07)
Basophils Absolute: 0 10*3/uL (ref 0.0–0.1)
Basophils Relative: 0 %
Eosinophils Absolute: 0 10*3/uL (ref 0.0–0.5)
Eosinophils Relative: 0 %
HCT: 35.1 % — ABNORMAL LOW (ref 39.0–52.0)
Hemoglobin: 11.7 g/dL — ABNORMAL LOW (ref 13.0–17.0)
Immature Granulocytes: 1 %
Lymphocytes Relative: 7 %
Lymphs Abs: 1.3 10*3/uL (ref 0.7–4.0)
MCH: 26.2 pg (ref 26.0–34.0)
MCHC: 33.3 g/dL (ref 30.0–36.0)
MCV: 78.7 fL — ABNORMAL LOW (ref 80.0–100.0)
Monocytes Absolute: 0.8 10*3/uL (ref 0.1–1.0)
Monocytes Relative: 4 %
Neutro Abs: 15.5 10*3/uL — ABNORMAL HIGH (ref 1.7–7.7)
Neutrophils Relative %: 88 %
Platelet Count: 366 10*3/uL (ref 150–400)
RBC: 4.46 MIL/uL (ref 4.22–5.81)
RDW: 18.6 % — ABNORMAL HIGH (ref 11.5–15.5)
WBC Count: 17.7 10*3/uL — ABNORMAL HIGH (ref 4.0–10.5)
nRBC: 0 % (ref 0.0–0.2)

## 2020-03-15 LAB — URINALYSIS, ROUTINE W REFLEX MICROSCOPIC
Glucose, UA: NEGATIVE mg/dL
Hgb urine dipstick: NEGATIVE
Ketones, ur: NEGATIVE mg/dL
Leukocytes,Ua: NEGATIVE
Nitrite: NEGATIVE
Protein, ur: 100 mg/dL — AB
Specific Gravity, Urine: 1.014 (ref 1.005–1.030)
pH: 5 (ref 5.0–8.0)

## 2020-03-15 LAB — CMP (CANCER CENTER ONLY)
ALT: 65 U/L — ABNORMAL HIGH (ref 0–44)
AST: 124 U/L — ABNORMAL HIGH (ref 15–41)
Albumin: 2 g/dL — ABNORMAL LOW (ref 3.5–5.0)
Alkaline Phosphatase: 724 U/L — ABNORMAL HIGH (ref 38–126)
Anion gap: 14 (ref 5–15)
BUN: 36 mg/dL — ABNORMAL HIGH (ref 8–23)
CO2: 26 mmol/L (ref 22–32)
Calcium: 9.5 mg/dL (ref 8.9–10.3)
Chloride: 97 mmol/L — ABNORMAL LOW (ref 98–111)
Creatinine: 2.06 mg/dL — ABNORMAL HIGH (ref 0.61–1.24)
GFR, Est AFR Am: 39 mL/min — ABNORMAL LOW (ref >60)
GFR, Estimated: 34 mL/min — ABNORMAL LOW (ref >60)
Glucose, Bld: 213 mg/dL — ABNORMAL HIGH (ref 70–99)
Potassium: 3.7 mmol/L (ref 3.5–5.1)
Sodium: 137 mmol/L (ref 135–145)
Total Bilirubin: 12.6 mg/dL (ref 0.3–1.2)
Total Protein: 7.2 g/dL (ref 6.5–8.1)

## 2020-03-15 LAB — SARS CORONAVIRUS 2 BY RT PCR (HOSPITAL ORDER, PERFORMED IN ~~LOC~~ HOSPITAL LAB): SARS Coronavirus 2: NEGATIVE

## 2020-03-15 LAB — GLUCOSE, CAPILLARY
Glucose-Capillary: 138 mg/dL — ABNORMAL HIGH (ref 70–99)
Glucose-Capillary: 152 mg/dL — ABNORMAL HIGH (ref 70–99)

## 2020-03-15 LAB — LACTATE DEHYDROGENASE: LDH: 305 U/L — ABNORMAL HIGH (ref 98–192)

## 2020-03-15 LAB — PROTIME-INR
INR: 1.1 (ref 0.8–1.2)
Prothrombin Time: 13.4 seconds (ref 11.4–15.2)

## 2020-03-15 LAB — HIV ANTIBODY (ROUTINE TESTING W REFLEX): HIV Screen 4th Generation wRfx: NONREACTIVE

## 2020-03-15 MED ORDER — OXYCODONE HCL 5 MG PO TABS
5.0000 mg | ORAL_TABLET | Freq: Four times a day (QID) | ORAL | Status: DC | PRN
  Administered 2020-03-15 – 2020-03-16 (×3): 5 mg via ORAL
  Filled 2020-03-15 (×3): qty 1

## 2020-03-15 MED ORDER — INSULIN ASPART 100 UNIT/ML ~~LOC~~ SOLN
0.0000 [IU] | Freq: Three times a day (TID) | SUBCUTANEOUS | Status: DC
  Administered 2020-03-15 – 2020-03-16 (×2): 3 [IU] via SUBCUTANEOUS
  Administered 2020-03-16: 2 [IU] via SUBCUTANEOUS

## 2020-03-15 MED ORDER — ONDANSETRON HCL 4 MG PO TABS: 4.0000 mg | ORAL_TABLET | Freq: Four times a day (QID) | ORAL | Status: DC | PRN

## 2020-03-15 MED ORDER — POLYETHYLENE GLYCOL 3350 17 G PO PACK: 17.0000 g | PACK | Freq: Every day | ORAL | Status: DC | PRN

## 2020-03-15 MED ORDER — PANTOPRAZOLE SODIUM 40 MG PO TBEC
40.0000 mg | DELAYED_RELEASE_TABLET | Freq: Every day | ORAL | Status: DC
  Administered 2020-03-16: 40 mg via ORAL
  Filled 2020-03-15: qty 1

## 2020-03-15 MED ORDER — ENOXAPARIN SODIUM 40 MG/0.4ML ~~LOC~~ SOLN
40.0000 mg | SUBCUTANEOUS | Status: DC
  Administered 2020-03-15: 40 mg via SUBCUTANEOUS
  Filled 2020-03-15: qty 0.4

## 2020-03-15 MED ORDER — ONDANSETRON HCL 4 MG/2ML IJ SOLN: 4.0000 mg | Freq: Four times a day (QID) | INTRAMUSCULAR | Status: DC | PRN

## 2020-03-15 MED ORDER — HYDRALAZINE HCL 25 MG PO TABS: 25.0000 mg | ORAL_TABLET | Freq: Four times a day (QID) | ORAL | Status: DC | PRN

## 2020-03-15 MED ORDER — FUROSEMIDE 40 MG PO TABS
40.0000 mg | ORAL_TABLET | Freq: Every day | ORAL | Status: DC
  Administered 2020-03-15 – 2020-03-16 (×2): 40 mg via ORAL
  Filled 2020-03-15 (×2): qty 1

## 2020-03-15 MED ORDER — INSULIN GLARGINE 100 UNIT/ML ~~LOC~~ SOLN
12.0000 [IU] | Freq: Every day | SUBCUTANEOUS | Status: DC
  Administered 2020-03-15: 12 [IU] via SUBCUTANEOUS
  Filled 2020-03-15 (×2): qty 0.12

## 2020-03-15 MED ORDER — POTASSIUM CHLORIDE CRYS ER 20 MEQ PO TBCR
20.0000 meq | EXTENDED_RELEASE_TABLET | Freq: Every day | ORAL | Status: DC
  Administered 2020-03-15 – 2020-03-16 (×2): 20 meq via ORAL
  Filled 2020-03-15 (×2): qty 1

## 2020-03-15 MED ORDER — SODIUM CHLORIDE 0.9 % IV SOLN: INTRAVENOUS | Status: DC

## 2020-03-15 MED ORDER — INSULIN ASPART 100 UNIT/ML ~~LOC~~ SOLN: 0.0000 [IU] | Freq: Every day | SUBCUTANEOUS | Status: DC

## 2020-03-15 NOTE — Progress Notes (Signed)
Pt to be admitted to the ED with increased bilirubin of 12.6 and jaundice. Also has worsening creatinine. Pt c/o feeling very weak and very fatigued. Has had 7 lb weight loss and no appetite. Pt transported in w/c with his sister in attendance.

## 2020-03-15 NOTE — Progress Notes (Signed)
Critical value received from lab at 1118.  Bili 12.56.  Message given directly to Dr Lorenso Courier at 1121.

## 2020-03-15 NOTE — Progress Notes (Signed)
[place] [phone]   [phone]  PROGRESS NOTE  Patient Care Team: Marliss Coots, NP as PCP - General Lorenso Courier Verita Lamb, MD as Consulting Physician (Hematology and Oncology)  Hematological/Oncological History # Intrahepatic Cholangiocarcinoma of the Liver, Locally Advanced 1) 02/16/2020: presented to the emergency department with diffuse abdominal pain. CT abdomen showed multiple irregular low density abnormalities are noted throughout the liver concerning for metastatic disease. Additionally noted to have mildly enlarged retroperitoneal lymph nodes are noted which may be inflammatory or metastatic in origin 2) 02/23/2020: establish care with Dr. Lorenso Courier  3) 03/05/2020: US biopsy shows adenocarcinoma of pancreaticobiliary origin, upper GI vs cholangiocarcinoma. 4) 03/15/2020: presented f/u visit to discuss biopsy results. Found to have Tbili 12.6, Alk phos 724, AST 124, ALT 65, Cr 2.0. Sent to the ED to assess for obstruction.  Interval History:  [name] 64 y.o. male with medical history significant for newly diagnosed cholangiocarcinoma who presents for a follow up visit. The patient's last visit was on 02/23/2020. In the interim since the last visit the patient had a biopsy of the lesion confirming adenocarcinoma of the liver.   On exam today history [name] notes that he has had a decline in his health since our last visit on 02/23/2020.  He notes that he has had increasing fatigue and tiredness as well as some pain in his back, though he thinks the pain in the back may be secondary to him laying on the couch.  He states he has had marked swelling in his lower extremities as well.  He reports that he is eating okay and that his sister has been helping to provide him with food.  He denies having any issues with nausea, vomiting, or diarrhea.  He has been having yellowing of his eyes and his urine has turned darker than usual despite the fact that  he has been doing his best to stay hydrated.  He reports that other than his back soreness he is not having any pain in his abdomen or elsewhere in his body.  He currently denies having any issues with fevers, chills, sweats, shortness breath, or chest pain.  A full 10 point ROS is listed below.  MEDICAL HISTORY:  Past Medical History:  Diagnosis Date   Cancer ([place])    Diabetes mellitus without complication ([place])     SURGICAL HISTORY: Past Surgical History:  Procedure Laterality Date   APPENDECTOMY      SOCIAL HISTORY: Social History   Socioeconomic History   Marital status: Single    Spouse name: Not on file   Number of children: Not on file   Years of education: Not on file   Highest education level: Not on file  Occupational History   Not on file  Tobacco Use   Smoking status: Current Every Day Smoker    Packs/day: 0.50    Years: 40.00    Pack years: 20.00    Types: Cigarettes   Smokeless tobacco: Never Used  Substance and Sexual Activity   Alcohol use: No   Drug use: No    Types: Cocaine    Comment: used cocaine 2 months   Sexual activity: Not Currently    Comment: no partner in 1 year  Other Topics Concern   Not on file  Social History Narrative   Not on file   Social Determinants of Health   Financial Resource Strain:    Difficulty of Paying Living Expenses:   Food Insecurity:  Worried About Charity fundraiser in the Last Year:    Arboriculturist in the Last Year:   Transportation Needs:    Film/video editor (Medical):    Lack of Transportation (Non-Medical):   Physical Activity:    Days of Exercise per Week:    Minutes of Exercise per Session:   Stress:    Feeling of Stress :   Social Connections:    Frequency of Communication with Friends and Family:    Frequency of Social Gatherings with Friends and Family:    Attends Religious Services:    Active Member of Clubs or Organizations:    Attends Programme researcher, broadcasting/film/video:    Marital Status:   Intimate Partner Violence:    Fear of Current or Ex-Partner:    Emotionally Abused:    Physically Abused:    Sexually Abused:     FAMILY HISTORY: Family History  Problem Relation Age of Onset   Heart disease Mother    Hypertension Mother    Diabetes Mother    Alcohol abuse Father    Diabetes Brother    Hypertension Brother    Stroke Sister    Hypertension Sister    Diabetes Sister     ALLERGIES:  has No Known Allergies.  MEDICATIONS:  Current Outpatient Medications  Medication Sig Dispense Refill   atorvastatin (LIPITOR) 40 MG tablet Take 1 tablet (40 mg total) by mouth daily at 6 PM. 30 tablet 5   insulin NPH-regular Human (70-30) 100 UNIT/ML injection Inject 10 Units into the skin.     lisinopril (ZESTRIL) 40 MG tablet Take 40 mg by mouth daily.     omeprazole (PRILOSEC) 20 MG capsule Take 20 mg by mouth daily.     oxyCODONE (ROXICODONE) 5 MG immediate release tablet Take 1 tablet (5 mg total) by mouth every 6 (six) hours as needed for up to 15 doses for severe pain or breakthrough pain. (Patient not taking: Reported on 02/23/2020) 15 tablet 0   No current facility-administered medications for this visit.    REVIEW OF SYSTEMS:   Constitutional: ( - ) fevers, ( - )  chills , ( - ) night sweats Eyes: ( - ) blurriness of vision, ( - ) double vision, ( - ) watery eyes Ears, nose, mouth, throat, and face: ( - ) mucositis, ( - ) sore throat Respiratory: ( - ) cough, ( - ) dyspnea, ( - ) wheezes Cardiovascular: ( - ) palpitation, ( - ) chest discomfort, ( - ) lower extremity swelling Gastrointestinal:  ( - ) nausea, ( - ) heartburn, ( - ) change in bowel habits Skin: ( - ) abnormal skin rashes Lymphatics: ( - ) new lymphadenopathy, ( - ) easy bruising Neurological: ( - ) numbness, ( - ) tingling, ( - ) new weaknesses Behavioral/Psych: ( - ) mood change, ( - ) new changes  All other systems were reviewed with the  patient and are negative.  PHYSICAL EXAMINATION: ECOG PERFORMANCE STATUS: 2 - Symptomatic, <50% confined to bed  Vitals:   03/15/20 1103  BP: 133/89  Pulse: 87  Resp: 20  Temp: 97.6 F (36.4 C)  SpO2: 100%   Filed Weights   03/15/20 1103  Weight: 215 lb 14.4 oz (97.9 kg)    GENERAL: ill appearing middle aged [place] American male. Alert, no distress and comfortable SKIN: skin color, texture, turgor are normal, no rashes or significant lesions EYES: conjunctiva are pink and non-injected, sclera clear  LUNGS: clear to auscultation and percussion with normal breathing effort HEART: regular rate & rhythm and no murmurs and no lower extremity edema ABDOMEN: soft, non-tender, non-distended, normal bowel sounds Musculoskeletal: no cyanosis of digits and no clubbing  PSYCH: alert & oriented x 3, fluent speech NEURO: no focal motor/sensory deficits  LABORATORY DATA:  I have reviewed the data as listed CBC Latest Ref Rng & Units 03/15/2020 03/05/2020 02/23/2020  WBC 4.0 - 10.5 K/uL 17.7(H) 17.7(H) 14.5(H)  Hemoglobin 13.0 - 17.0 g/dL 11.7(L) 12.7(L) 12.1(L)  Hematocrit 39 - 52 % 35.1(L) 37.3(L) 36.3(L)  Platelets 150 - 400 K/uL 366 401(H) 288    CMP Latest Ref Rng & Units 03/15/2020 03/05/2020 02/23/2020  Glucose 70 - 99 mg/dL 213(H) 177(H) 193(H)  BUN 8 - 23 mg/dL 36(H) 28(H) 17  Creatinine 0.61 - 1.24 mg/dL 2.06(H) 1.65(H) 1.77(H)  Sodium 135 - 145 mmol/L 137 133(L) 141  Potassium 3.5 - 5.1 mmol/L 3.7 3.8 4.5  Chloride 98 - 111 mmol/L 97(L) 93(L) 101  CO2 22 - 32 mmol/L _0 Calcium 8.9 - 10.3 mg/dL 9.5 8.9 9.6  Total Protein 6.5 - 8.1 g/dL 7.2 7.5 7.8  Total Bilirubin 0.3 - 1.2 mg/dL 12.6(HH) 8.6(H) 3.3(H)  Alkaline Phos 38 - 126 U/L 724(H) 602(H) 617(H)  AST 15 - 41 U/L 124(H) 111(H) 103(H)  ALT 0 - 44 U/L 65(H) 74(H) 78(H)    RADIOGRAPHIC STUDIES: I have personally reviewed the radiological images as listed and agreed with the findings in the report: no evidence of  metastatic disease in the chest.  CT Chest W Contrast  Result Date: 03/02/2020 CLINICAL DATA:  Indeterminate hepatic lesion identified on CT. Concern for malignancy. EXAM: CT CHEST WITH CONTRAST TECHNIQUE: Multidetector CT imaging of the chest was performed during intravenous contrast administration. CONTRAST:  58m OMNIPAQUE IOHEXOL 300 MG/ML  SOLN COMPARISON:  CT 02/16/2020 FINDINGS: Cardiovascular: No significant vascular findings. Normal heart size. No pericardial effusion. Mediastinum/Nodes: No axillary supraclavicular adenopathy no mediastinal hilar adenopathy no pericardial effusion. Esophagus normal. Lungs/Pleura: Small RIGHT upper lobe pulmonary nodule measures 4 mm (image 45/series 7). Mild band of atelectasis in the RIGHT lower lobe. No LEFT lung nodularity. Upper Abdomen: Lesions described within the liver on comparison CT are less conspicuous on chest CT. Liver has nodular contour. Small amount of free fluid. Musculoskeletal: No aggressive osseous lesion. Degenerative osteophytosis of the spine. IMPRESSION: 1. No evidence of primary malignancy in the thorax. 2. Small RIGHT upper lobe pulmonary nodule. No follow-up needed if patient is low-risk. Non-contrast chest CT can be considered in 12 months if patient is high-risk. This recommendation follows the consensus statement: Guidelines for Management of Incidental Pulmonary Nodules Detected on CT Images: From the Fleischner Society 2017; Radiology 2017; 284:228-243. 3. Hepatic lesions less well defined on chest CT. Small intraperitoneal free fluid. Electronically Signed   By: SSuzy BouchardM.D.   On: 03/02/2020 08:41   CT Abdomen Pelvis W Contrast  Result Date: 02/16/2020 CLINICAL DATA:  Acute generalized abdominal pain. EXAM: CT ABDOMEN AND PELVIS WITH CONTRAST TECHNIQUE: Multidetector CT imaging of the abdomen and pelvis was performed using the standard protocol following bolus administration of intravenous contrast. CONTRAST:  1074mOMNIPAQUE  IOHEXOL 300 MG/ML  SOLN COMPARISON:  None. FINDINGS: Lower chest: No acute abnormality. Hepatobiliary: No gallstones or biliary dilatation is noted. Multiple irregular low density abnormalities are noted throughout the liver concerning for metastatic disease. These are most prominently seen in the left hepatic lobe. Pancreas: Unremarkable. No pancreatic  ductal dilatation or surrounding inflammatory changes. Spleen: Normal in size without focal abnormality. Adrenals/Urinary Tract: Adrenal glands appear normal. Left renal cyst is noted. No hydronephrosis or renal obstruction is noted. Urinary bladder is unremarkable. Stomach/Bowel: The stomach appears normal. There is no evidence of bowel obstruction or inflammation. Status post appendectomy. Vascular/Lymphatic: Atherosclerosis of abdominal aorta is noted without aneurysm or dissection. Mildly enlarged lymph nodes are noted in the retroperitoneal region which may be inflammatory or metastatic in origin. Reproductive: Moderately enlarged prostate gland is noted. Other: Mild ascites is noted.  No definite hernia is noted. Musculoskeletal: No acute or significant osseous findings. IMPRESSION: 1. Multiple irregular low density abnormalities are noted throughout the liver concerning for metastatic disease. These are most prominently seen in the left hepatic lobe. Further evaluation with MRI with and without gadolinium administration is recommended. 2. Mildly enlarged retroperitoneal lymph nodes are noted which may be inflammatory or metastatic in origin. 3. Mild ascites is noted. 4. Moderately enlarged prostate gland. Aortic Atherosclerosis (ICD10-I70.0). Electronically Signed   By: Marijo Conception M.D.   On: 02/16/2020 16:42   US BIOPSY (LIVER)  Result Date: 03/05/2020 INDICATION: 64 year old with markedly abnormal liver enzymes and diffuse disease throughout the liver which is suggestive for multiple liver lesions. Tissue diagnosis is needed. Patient also has  perihepatic ascites. EXAM: ULTRASOUND-GUIDED PARACENTESIS ULTRASOUND-GUIDED LIVER LESION BIOPSY MEDICATIONS: None. ANESTHESIA/SEDATION: Moderate (conscious) sedation was employed during this procedure. A total of Versed 2.0 mg and Fentanyl 100 mcg was administered intravenously. Moderate Sedation Time: 31 minutes minutes. The patient's level of consciousness and vital signs were monitored continuously by radiology nursing throughout the procedure under my direct supervision. FLUOROSCOPY TIME:  None COMPLICATIONS: None immediate. PROCEDURE: Informed written consent was obtained from the patient after a thorough discussion of the procedural risks, benefits and alternatives. All questions were addressed. A timeout was performed prior to the initiation of the procedure. The right side of the abdomen was evaluated with ultrasound. Perihepatic ascites was identified. The right side of the abdomen was prepped with chlorhexidine and sterile field was created. Maximal barrier sterile technique was utilized including caps, mask, sterile gowns, sterile gloves, sterile drape, hand hygiene and skin antiseptic. Skin was anesthetized with 1% lidocaine. Small incision was made. Using ultrasound guidance, a Safe-T-Centesis catheter was directed into the perihepatic ascites. Yellow-green ascites was removed. Approximately 1 L of fluid was removed. After the paracentesis, attention was directed to the liver biopsy. Lesion in the right hepatic lobe was targeted for biopsy. Overlying skin was anesthetized with 1% lidocaine. A small incision was made. Using ultrasound guidance, 17 gauge coaxial needle was directed into the right hepatic lobe and into the lesion. Total of 4 core biopsies were obtained. Core specimens were obtained of the lesion and surrounding parenchyma. Specimens placed in formalin. Small amount of Gel-Foam was injected along the needle track as a Gel-Foam slurry. Needle was removed without complication. Follow-up  ultrasound images were obtained. FINDINGS: Perihepatic ascites. 1 L of yellow-green ascites was removed the perihepatic space. Lesion in the right hepatic lobe was targeted for biopsy. No immediate bleeding or hematoma formation following the core biopsies. Patient has innumerable lesions scattered throughout the liver. Small amount of residual ascites along the inferior aspect of the liver after the biopsy and paracentesis. No significant ascites at the biopsy site. IMPRESSION: Ultrasound-guided paracentesis and ultrasound-guided liver lesion biopsy. Core specimens were placed in formalin. 1 L of ascites was obtained and fluid was sent for cytology. Patient's total bilirubin is markedly  elevated, measuring 8.6 and previously measuring 3.3. Referring physician Dr. Lorenso Courier was made aware of these findings. Electronically Signed   By: Markus Daft M.D.   On: 03/05/2020 16:05   US Paracentesis  Result Date: 03/05/2020 INDICATION: 64 year old with markedly abnormal liver enzymes and diffuse disease throughout the liver which is suggestive for multiple liver lesions. Tissue diagnosis is needed. Patient also has perihepatic ascites. EXAM: ULTRASOUND-GUIDED PARACENTESIS ULTRASOUND-GUIDED LIVER LESION BIOPSY MEDICATIONS: None. ANESTHESIA/SEDATION: Moderate (conscious) sedation was employed during this procedure. A total of Versed 2.0 mg and Fentanyl 100 mcg was administered intravenously. Moderate Sedation Time: 31 minutes minutes. The patient's level of consciousness and vital signs were monitored continuously by radiology nursing throughout the procedure under my direct supervision. FLUOROSCOPY TIME:  None COMPLICATIONS: None immediate. PROCEDURE: Informed written consent was obtained from the patient after a thorough discussion of the procedural risks, benefits and alternatives. All questions were addressed. A timeout was performed prior to the initiation of the procedure. The right side of the abdomen was evaluated with  ultrasound. Perihepatic ascites was identified. The right side of the abdomen was prepped with chlorhexidine and sterile field was created. Maximal barrier sterile technique was utilized including caps, mask, sterile gowns, sterile gloves, sterile drape, hand hygiene and skin antiseptic. Skin was anesthetized with 1% lidocaine. Small incision was made. Using ultrasound guidance, a Safe-T-Centesis catheter was directed into the perihepatic ascites. Yellow-green ascites was removed. Approximately 1 L of fluid was removed. After the paracentesis, attention was directed to the liver biopsy. Lesion in the right hepatic lobe was targeted for biopsy. Overlying skin was anesthetized with 1% lidocaine. A small incision was made. Using ultrasound guidance, 17 gauge coaxial needle was directed into the right hepatic lobe and into the lesion. Total of 4 core biopsies were obtained. Core specimens were obtained of the lesion and surrounding parenchyma. Specimens placed in formalin. Small amount of Gel-Foam was injected along the needle track as a Gel-Foam slurry. Needle was removed without complication. Follow-up ultrasound images were obtained. FINDINGS: Perihepatic ascites. 1 L of yellow-green ascites was removed the perihepatic space. Lesion in the right hepatic lobe was targeted for biopsy. No immediate bleeding or hematoma formation following the core biopsies. Patient has innumerable lesions scattered throughout the liver. Small amount of residual ascites along the inferior aspect of the liver after the biopsy and paracentesis. No significant ascites at the biopsy site. IMPRESSION: Ultrasound-guided paracentesis and ultrasound-guided liver lesion biopsy. Core specimens were placed in formalin. 1 L of ascites was obtained and fluid was sent for cytology. Patient's total bilirubin is markedly elevated, measuring 8.6 and previously measuring 3.3. Referring physician Dr. Lorenso Courier was made aware of these findings. Electronically  Signed   By: Markus Daft M.D.   On: 03/05/2020 16:05    ASSESSMENT & PLAN Webber O Packard 64 y.o. male with medical history significant for newly diagnosed cholangiocarcinoma who presents for a follow up visit.  After review the imaging, review the pathology, and review the labs the patient's findings are most consistent with an intrahepatic cholangiocarcinoma.  Unfortunately he has had marked worsening since his prior visit on 02/23/2020 with increased symptoms and dramatically worsening LFTs with an increase in his bilirubin up to 12.6, alkaline phosphatase at 724, and increasing creatinine up to 2.06.  Given these findings I would recommend sending the patient to the emergency department for consideration of abdominal ultrasound to search for biliary obstruction.  The patient is found to have an obstruction we could consult GI for  consideration of ERCP in order to alleviate the blockage.  In the event that there is no evidence of a blockage then the most likely etiology is liver damage from his malignancy.    I am concerned that given his level of deconditioning and the marked worsening of his labs that he may not be a viable candidate for systemic treatment with chemotherapy.  Additionally given the locally aggressive spread of the cancer to local lymph nodes I do not think he would be a viable candidate for surgical resection.  As such the results of the imaging from his emergency department visit will help to determine the next course of action.  I do think it would be reasonable to consider hospice and comfort based care given the extent of the disease and the rapid worsening.  # Intrahepatic Cholangiocarcinoma of the Liver, Locally Advanced --with patient's marked increase in LFTs I am concerned that he may have obstruction vs. Worsening liver damage. Sent to ED for [place] to assess for obstruction. If obstruction noted can consult GI for consideration of ERCP --patient's disease is extensive with  concurrent liver/kidney damage. I am concerned that with these labs he may not be a viable candidate for cytotoxic therapy --if patient is agreeable to proceeding with treatment would recommend gemcitabine + oxaliplatin --f/u plan pending the results of his ED visit and resultant imaging.   #Goals of Care --discussed the incurable nature of his disease as well as the poor prognosis --patient wants his sister [name] to be his MPOA during the course of his care.  --given his rapid decline he would be a good candidate for hospice/comfort based care. We will continue to address this.     No orders of the defined types were placed in this encounter.   All questions were answered. The patient knows to call the clinic with any problems, questions or concerns.  A total of more than 30 minutes were spent on this encounter and over half of that time was spent on counseling and coordination of care as outlined above.   Ledell Peoples, MD Department of Hematology/Oncology [facility] Phone: [phone] Pager: [phone] Email: [email or web address]  03/15/2020 12:42 PM

## 2020-03-15 NOTE — Progress Notes (Signed)
Report received from ED 

## 2020-03-15 NOTE — H&P (Signed)
History and Physical  Patient Name: [name]     FUX:[id number]    DOB: 1956/08/28    DOA: 03/15/2020 PCP: Marliss Coots, NP  Patient coming from: Home to [facility]   Chief Complaint: Jaundice       HPI: [name] is a 64 y.o. M with hx DM, HTN, and recently dx'd metastatic cholangiocarcinoma who presents with few weeks progressive fatigue and jaundice from [facility].  Patient presented with abdominal pain last month, found on imaging in the ER to have multiple liver lesions.  Had US guided liver biopsy on 7/2 that showed likely cholangiocarcinoma.    In there interval, has developed new leg swelling, fatigue, dark urine and icterus that were slowly getting worse.  Presented today to his first post-biopsy visit to Oncology/Dr. Lorenso Courier, who found Tbili 12 (up from 8 one week ago) and elevated Alk phos and suspected obstructive jaundice and sent him to the ER.          ROS: Review of Systems  Constitutional: Positive for malaise/fatigue. Negative for chills and fever.  Respiratory: Negative for cough, sputum production, shortness of breath and wheezing.   Cardiovascular: Positive for leg swelling. Negative for chest pain and orthopnea.  Gastrointestinal: Negative for abdominal pain and vomiting.  All other systems reviewed and are negative.         Past Medical History:  Diagnosis Date  . Cancer ([place])   . Diabetes mellitus without complication ([place])   . Hypertension     Past Surgical History:  Procedure Laterality Date  . APPENDECTOMY      Social History: Patient lives alone.  The patient walks unassisted.  Remote former smoker.  Former substance use, no alcohol or illicit drugs now at all.  No Known Allergies  Family history: family history includes Alcohol abuse in his father; Diabetes in his brother, mother, and sister; Heart disease in his mother; Hypertension in his brother, mother, and sister; Stroke in his sister.  Prior to Admission  medications   Medication Sig Start Date End Date Taking? Authorizing Provider  acetaminophen (TYLENOL) 325 MG tablet Take 325 mg by mouth every 6 (six) hours as needed for mild pain or headache.   Yes [provider]  aspirin-sod bicarb-citric acid (ALKA-SELTZER) 325 MG TBEF tablet Take 325 mg by mouth every 6 (six) hours as needed (congestion).   Yes [provider]  atorvastatin (LIPITOR) 40 MG tablet Take 1 tablet (40 mg total) by mouth daily at 6 PM. 11/01/15  Yes Dettinger, Fransisca Kaufmann, MD  insulin NPH-regular Human (70-30) 100 UNIT/ML injection Inject 10 Units into the skin daily.    Yes [provider]  lisinopril (ZESTRIL) 40 MG tablet Take 40 mg by mouth daily. 01/26/20  Yes [provider]  omeprazole (PRILOSEC) 20 MG capsule Take 20 mg by mouth daily. 01/26/20  Yes [provider]  oxyCODONE (ROXICODONE) 5 MG immediate release tablet Take 1 tablet (5 mg total) by mouth every 6 (six) hours as needed for up to 15 doses for severe pain or breakthrough pain. Patient not taking: Reported on 02/23/2020 02/16/20   Lennice Sites, DO       Physical Exam: BP (!) 162/96 (BP Location: Left Arm)   Pulse 73   Temp 97.8 F (36.6 C) (Oral)   Resp 17   SpO2 100%  General appearance: Large framed, thin adult male, alert and in no acute distress.   Eyes: Icteric, conjunctiva pink, lids and lashes  normal. PERRL.    ENT: No nasal deformity, discharge, epistaxis.  Hearing normal. OP moist without lesions.  Lips moist, half edentulous.  Intraoral jaundice noted. Skin: Warm and dry.   No suspicious rashes or lesions. Cardiac: RRR, nl S1-S2, no murmurs appreciated.  Capillary refill is brisk.  JVP normal.  3+ LE edema.  Radial pulses 2+ and symmetric. Respiratory: Normal respiratory rate and rhythm.  CTAB without rales or wheezes. Abdomen: Abdomen soft.  Liver edge palpable.  No splenomegaly.  No TTP or guadring. No ascites, distension.   MSK: No deformities or  effusions of the large joints of the upper or lower extremities bilaterally.  No cyanosis or clubbing. Neuro: Cranial nerves 3-12 intact.  No asterexis, no confusion.  Sensation intact to light touch. Speech is fluent.  Muscle strength 5/5 and symmetric.    Psych: Sensorium intact and responding to questions, attention normal.  Behavior appropriate.  Affect blunted.  Judgment and insight appear normal.     Labs on Admission:  I have personally reviewed following labs and imaging studies: CBC: Recent Labs  Lab 03/15/20 1025  WBC 17.7*  NEUTROABS 15.5*  HGB 11.7*  HCT 35.1*  MCV 78.7*  PLT 876   Basic Metabolic Panel: Recent Labs  Lab 03/15/20 1025  NA 137  K 3.7  CL 97*  CO2 26  GLUCOSE 213*  BUN 36*  CREATININE 2.06*  CALCIUM 9.5   GFR: Estimated Creatinine Clearance: 43.2 mL/min (A) (by C-G formula based on SCr of 2.06 mg/dL (H)).  Liver Function Tests: Recent Labs  Lab 03/15/20 1025  AST 124*  ALT 65*  ALKPHOS 724*  BILITOT 12.6*  PROT 7.2  ALBUMIN 2.0*   Coagulation Profile: Recent Labs  Lab 03/15/20 1026  INR 1.1     Recent Results (from the past 240 hour(s))  SARS Coronavirus 2 by RT PCR (hospital order, performed in [facility] hospital lab) Nasopharyngeal Nasopharyngeal Swab     Status: None   Collection Time: 03/15/20  1:07 PM   Specimen: Nasopharyngeal Swab  Result Value Ref Range Status   SARS Coronavirus 2 NEGATIVE NEGATIVE Final    Comment: (NOTE) SARS-CoV-2 target nucleic acids are NOT DETECTED.  The SARS-CoV-2 RNA is generally detectable in upper and lower respiratory specimens during the acute phase of infection. The lowest concentration of SARS-CoV-2 viral copies this assay can detect is 250 copies / mL. A negative result does not preclude SARS-CoV-2 infection and should not be used as the sole basis for treatment or other patient management decisions.  A negative result may occur with improper specimen collection / handling,  submission of specimen other than nasopharyngeal swab, presence of viral mutation(s) within the areas targeted by this assay, and inadequate number of viral copies (<250 copies / mL). A negative result must be combined with clinical observations, patient history, and epidemiological information.  Fact Sheet for Patients:   StrictlyIdeas.no  Fact Sheet for Healthcare Providers: BankingDealers.co.za  This test is not yet approved or  cleared by the [place] FDA and has been authorized for detection and/or diagnosis of SARS-CoV-2 by FDA under an Emergency Use Authorization (EUA).  This EUA will remain in effect (meaning this test can be used) for the duration of the COVID-19 declaration under Section 564(b)(1) of the Act, 21 U.S.C. section 360bbb-3(b)(1), unless the authorization is terminated or revoked sooner.  Performed at [facility], [place] [address], [place], [place] [address]  Assessment/Plan   Jaundice Given the degree of bilirubin, suspect this is obstructive, but also may just be destructive.  If obstructive, GI may be unable to offer ERCP drainage.  If destructive, I do not know if there is a treatment other than palliative chemo -Obtain abdomen US -Consult gastroenterology, appreciate recommendations -Consult Oncology   Type 2 diabetes, with diabetic nephropathy, insulin-dependent, uncontrolled Glucoses elevated -Hold home 70/30 -Low-dose Lantus -Sliding scale correction insulin  Hypertension Blood pressure elevated -Hold home lisinopril -Hydralazine for severe range pressures  Acute kidney injury on CKD stage III Baseline creatinine 1.3-1.6.  Here his creatinine is up to 2.  Given his fluid overload I suspect this is congestive -IV Lasix x1 -Strict ins and outs  GERD -Continue PPI      DVT prophylaxis: Lovenox  Code Status: FULL  Family Communication: Sister  by phone  Disposition Plan: Anticipate imaging and consultation with GI.  If there can be amelioration of his jaundice with procedure by GI, that would be excellent.  Otherwise I suspect his prognosis is even worse than was suspected. Consults called: GI, Onc Admission status: OBS   At the point of initial evaluation, it is my clinical opinion that admission for OBSERVATION is reasonable and necessary because the patient's presenting complaints in the context of their chronic conditions represent sufficient risk of deterioration or significant morbidity to constitute reasonable grounds for close observation in the hospital setting, but that the patient may be medically stable for discharge from the hospital within 24 to 48 hours.    Medical decision making: Patient seen at 3:44 PM on 03/15/2020.  The patient was discussed with Eustaquio Maize, PA-C.  What exists of the patient's chart was reviewed in depth and summarized above.  Clinical condition: stable.        [facility] Please page though [place] or Epic secure chat:  For password, contact charge nurse

## 2020-03-15 NOTE — ED Provider Notes (Signed)
[facility] DEPT Provider Note   CSN: [id number] Arrival date & time: 03/15/20  1159     History Chief Complaint  Patient presents with  . Abnormal Lab    [name] is a 64 y.o. male PMH significant for HTN, DM, and recent findings of liver metastases who presents to the ED from the [facility] for abnormal lab value.  Patient is followed by Dr. Lorenso Courier who performed ultrasound-guided biopsy 03/05/2020 which revealed adenocarcinoma of pancreaticobiliary origin, upper GI versus cholangiocarcinoma.  Patient was sent here today directly from an office visit with Dr. Lorenso Courier after his total bilirubin resulted at 12.6 and has been trending up over the course of the past 4 weeks significantly.  He is also experiencing worsening renal function and has been complaining of significant weakness and weight loss.  Per my examination, patient denies any pain symptoms.  He states that he has had significantly diminished appetite, but no abdominal tenderness.  He did have some ascites and they recently removed 1 L fluid via paracentesis.  He also has noted some lymphedema in lower extremities bilaterally x4 weeks and dark urine (bilirubin).  He denies any fevers or chills, night sweats, chest pain or difficulty breathing, cough, nausea or vomiting, or other symptoms.  HPI     Past Medical History:  Diagnosis Date  . Cancer ([place])   . Diabetes mellitus without complication ([place])   . Hypertension     Patient Active Problem List   Diagnosis Date Noted  . Jaundice 03/15/2020  . AKI (acute kidney injury) ([place]) 03/15/2020  . CKD (chronic kidney disease), stage III 03/15/2020  . Microcytic anemia 03/15/2020  . Lower leg crush injury 07/11/2013  . Essential hypertension 07/09/2013  . Rhabdomyolysis 07/09/2013  . Type 2 diabetes mellitus with diabetic nephropathy, with long-term current use of insulin ([place]) 07/08/2013  . Right leg injury 07/08/2013  . Tobacco abuse  07/08/2013  . 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 APPENDECTOMY         Family History  Problem Relation Age of Onset  . Heart disease Mother   . Hypertension Mother   . Diabetes Mother   . Alcohol abuse Father   . Diabetes Brother   . Hypertension Brother   . Stroke Sister   . Hypertension Sister   . Diabetes Sister     Social History   Tobacco Use  . Smoking status: Current Every Day Smoker    Packs/day: 0.50    Years: 40.00    Pack years: 20.00    Types: Cigarettes  . Smokeless tobacco: Never Used  Substance Use Topics  . Alcohol use: No  . Drug use: No    Types: Cocaine    Comment: used cocaine 2 months    Home Medications Prior to Admission medications   Medication Sig Start Date End Date Taking? Authorizing Provider  atorvastatin (LIPITOR) 40 MG tablet Take 1 tablet (40 mg total) by mouth daily at 6 PM. 11/01/15   Dettinger, Fransisca Kaufmann, MD  insulin NPH-regular Human (70-30) 100 UNIT/ML injection Inject 10 Units into the skin.    [provider]  lisinopril (ZESTRIL) 40 MG tablet Take 40 mg by mouth daily. 01/26/20   [provider]  omeprazole (PRILOSEC) 20 MG capsule Take 20 mg by mouth daily. 01/26/20   [provider]  oxyCODONE (ROXICODONE) 5 MG immediate release tablet Take 1 tablet (5 mg total)  by mouth every 6 (six) hours as needed for up to 15 doses for severe pain or breakthrough pain. Patient not taking: Reported on 02/23/2020 02/16/20   Lennice Sites, DO    Allergies    Patient has no known allergies.  Review of Systems   Review of Systems  All other systems reviewed and are negative.   Physical Exam Updated Vital Signs BP (!) 150/97   Pulse 70   Temp 97.8 F (36.6 C) (Oral)   Resp 17   SpO2 100%   Physical Exam Vitals and nursing note reviewed. Exam conducted with a chaperone present.  Constitutional:      Appearance: He is ill-appearing.    HENT:     Head: Normocephalic and atraumatic.  Eyes:     General: Scleral icterus present.  Cardiovascular:     Rate and Rhythm: Normal rate and regular rhythm.     Pulses: Normal pulses.     Heart sounds: Normal heart sounds.  Pulmonary:     Effort: Pulmonary effort is normal. No respiratory distress.     Breath sounds: Normal breath sounds.  Abdominal:     Comments: Soft, mildly distended.  Mild ascites noted.  No TTP in RUQ or elsewhere.  Musculoskeletal:     Cervical back: Normal range of motion and neck supple. No rigidity.  Skin:    General: Skin is dry.     Comments: Jaundiced.  Neurological:     Mental Status: He is alert.     GCS: GCS eye subscore is 4. GCS verbal subscore is 5. GCS motor subscore is 6.  Psychiatric:        Mood and Affect: Mood normal.        Behavior: Behavior normal.        Thought Content: Thought content normal.     ED Results / Procedures / Treatments   Labs (all labs ordered are listed, but only abnormal results are displayed) Labs Reviewed  SARS CORONAVIRUS 2 BY RT PCR (HOSPITAL ORDER, [facility])    EKG None  Radiology No results found.  Procedures Procedures (including critical care time)  Medications Ordered in ED Medications - No data to display  ED Course  I have reviewed the triage vital signs and the nursing notes.  Pertinent labs & imaging results that were available during my care of the patient were reviewed by me and considered in my medical decision making (see chart for details).  Clinical Course as of Mar 15 1332  Mon Mar 15, 2020  1333 I spoke with Dr. Shirley Friar who will place patient in observation now but will place orders and ultimately admit for further work-up.      [GG]    Clinical Course User Index [GG] Corena Herter, PA-C   MDM Rules/Calculators/A&P                          Patient would benefit from MRCP versus ERCP to further delineate his obstruction causing  bilirubinemia.  Discussed case with Dr. Francia Greaves who advised that we simply consult hospitalist to determine next steps.  Dr. Libby Maw note was still incomplete at time of my evaluation and chart review.  I spoke with Dr. Shirley Friar who will place patient in observation now but will place orders and ultimately admit for further work-up.    Final Clinical Impression(s) / ED Diagnoses Final diagnoses:  Jaundice    Rx / DC Orders ED  Discharge Orders    None       Reita Chard 03/15/20 1334    Valarie Merino, MD 03/15/20 [id number]

## 2020-03-15 NOTE — Progress Notes (Deleted)
Chemotherapy teaching provided to patient and spouse.  "Chemotherapy and You" book provided as well as Carbo/Taxol handouts. Pt and spouse encouraged to ask questions as they arise. Chemo consent signed and placed in chart.

## 2020-03-15 NOTE — ED Triage Notes (Signed)
From [facility] follow up after liver biopsy 7/2 and found abnormal lab today Billirubin-12.6

## 2020-03-16 ENCOUNTER — Observation Stay (HOSPITAL_COMMUNITY): Payer: Self-pay

## 2020-03-16 DIAGNOSIS — Z515 Encounter for palliative care: Secondary | ICD-10-CM

## 2020-03-16 DIAGNOSIS — E440 Moderate protein-calorie malnutrition: Secondary | ICD-10-CM | POA: Insufficient documentation

## 2020-03-16 LAB — CBC
HCT: 35.4 % — ABNORMAL LOW (ref 39.0–52.0)
Hemoglobin: 11.9 g/dL — ABNORMAL LOW (ref 13.0–17.0)
MCH: 27.4 pg (ref 26.0–34.0)
MCHC: 33.6 g/dL (ref 30.0–36.0)
MCV: 81.6 fL (ref 80.0–100.0)
Platelets: 390 10*3/uL (ref 150–400)
RBC: 4.34 MIL/uL (ref 4.22–5.81)
RDW: 19.3 % — ABNORMAL HIGH (ref 11.5–15.5)
WBC: 17.6 10*3/uL — ABNORMAL HIGH (ref 4.0–10.5)
nRBC: 0 % (ref 0.0–0.2)

## 2020-03-16 LAB — COMPREHENSIVE METABOLIC PANEL
ALT: 63 U/L — ABNORMAL HIGH (ref 0–44)
AST: 117 U/L — ABNORMAL HIGH (ref 15–41)
Albumin: 2.3 g/dL — ABNORMAL LOW (ref 3.5–5.0)
Alkaline Phosphatase: 666 U/L — ABNORMAL HIGH (ref 38–126)
Anion gap: 13 (ref 5–15)
BUN: 39 mg/dL — ABNORMAL HIGH (ref 8–23)
CO2: 28 mmol/L (ref 22–32)
Calcium: 9.1 mg/dL (ref 8.9–10.3)
Chloride: 96 mmol/L — ABNORMAL LOW (ref 98–111)
Creatinine, Ser: 1.82 mg/dL — ABNORMAL HIGH (ref 0.61–1.24)
GFR calc Af Amer: 45 mL/min — ABNORMAL LOW (ref >60)
GFR calc non Af Amer: 39 mL/min — ABNORMAL LOW (ref >60)
Glucose, Bld: 162 mg/dL — ABNORMAL HIGH (ref 70–99)
Potassium: 4 mmol/L (ref 3.5–5.1)
Sodium: 137 mmol/L (ref 135–145)
Total Bilirubin: 12.6 mg/dL — ABNORMAL HIGH (ref 0.3–1.2)
Total Protein: 7.1 g/dL (ref 6.5–8.1)

## 2020-03-16 LAB — HEMOGLOBIN A1C
Hgb A1c MFr Bld: 5.9 % — ABNORMAL HIGH (ref 4.8–5.6)
Mean Plasma Glucose: 122.63 mg/dL

## 2020-03-16 LAB — GLUCOSE, CAPILLARY
Glucose-Capillary: 156 mg/dL — ABNORMAL HIGH (ref 70–99)
Glucose-Capillary: 143 mg/dL — ABNORMAL HIGH (ref 70–99)

## 2020-03-16 LAB — HIV ANTIBODY (ROUTINE TESTING W REFLEX): HIV Screen 4th Generation wRfx: NONREACTIVE

## 2020-03-16 MED ORDER — OXYCODONE HCL 5 MG PO TABS: 5.0000 mg | ORAL_TABLET | Freq: Four times a day (QID) | ORAL | 0 refills | Status: AC | PRN

## 2020-03-16 MED ORDER — FUROSEMIDE 40 MG PO TABS: 40.0000 mg | ORAL_TABLET | Freq: Every day | ORAL | 0 refills | Status: AC | PRN

## 2020-03-16 MED ORDER — ENSURE ENLIVE PO LIQD: 237.0000 mL | Freq: Two times a day (BID) | ORAL | Status: DC

## 2020-03-16 NOTE — Progress Notes (Signed)
Initial Nutrition Assessment  DOCUMENTATION CODES:   Non-severe (moderate) malnutrition in context of chronic illness  INTERVENTION:   -Ensure Enlive po BID, each supplement provides 350 kcal and 20 grams of protein  NUTRITION DIAGNOSIS:   Moderate Malnutrition related to chronic illness, cancer and cancer related treatments as evidenced by percent weight loss, mild fat depletion, mild muscle depletion.  GOAL:   Patient will meet greater than or equal to 90% of their needs  MONITOR:   PO intake, Supplement acceptance, Labs, Weight trends, I & O's  REASON FOR ASSESSMENT:   Malnutrition Screening Tool    ASSESSMENT:   64 y.o. M with hx DM, HTN, and recently dx'd metastatic cholangiocarcinoma who presents with few weeks progressive fatigue and jaundice from [facility]. Patient presented with abdominal pain last month, found on imaging in the ER to have multiple liver lesions.  Had US guided liver biopsy on 7/2 that showed likely cholangiocarcinoma.  Patient in room with sister at bedside. Pt reports his appetite is okay, states sometimes he doesn't eat that much of his meals. Denies N/V. Denies issues with swallowing or chewing. Pt states he ate breakfast this morning with no issues. Pt states about a month ago his appetite began to change when foods started to taste extra salty. Currently he states this is not an issue anymore. Pt is interested in Ensure supplements, will order. RD answered some diet related questions that the pt's sister had.   Pt reports UBW of 240 lbs. States he began to lose weight in June 2021. Per weight records ,pt has lost 7 lbs since 6/21 (3% wt loss x 3 weeks, significant for time frame).   Medications: Lasix, KLOR-CON Labs reviewed: CBGs: 152  HgbA1c: 5.9  NUTRITION - FOCUSED PHYSICAL EXAM:    Most Recent Value  Orbital Region No depletion  Upper Arm Region Mild depletion  Thoracic and Lumbar Region Unable to assess  Buccal Region Mild  depletion  Temple Region Mild depletion  Clavicle Bone Region Moderate depletion  Clavicle and Acromion Bone Region Mild depletion  Scapular Bone Region Mild depletion  Dorsal Hand Mild depletion  Patellar Region Mild depletion  Anterior Thigh Region Unable to assess  Posterior Calf Region Mild depletion  Hair Reviewed  Eyes Reviewed  [jaundiced]  Mouth Reviewed  Skin Reviewed  Nails Reviewed       Diet Order:   Diet Order            Diet Carb Modified Fluid consistency: Thin; Room service appropriate? Yes  Diet effective now                 EDUCATION NEEDS:   Education needs have been addressed  Skin:  Skin Assessment: Reviewed RN Assessment  Last BM:  7/12  Height:   Ht Readings from Last 1 Encounters:  03/15/20 6\' 2"  (1.88 m)    Weight:   Wt Readings from Last 1 Encounters:  03/15/20 97.9 kg    BMI:  Body mass index is 27.71 kg/m.  Estimated Nutritional Needs:   Kcal:  2100-2300  Protein:  100-110g  Fluid:  2L/day   Clayton Bibles, MS, RD, LDN Inpatient Clinical Dietitian Contact information available via Am[facility]

## 2020-03-16 NOTE — Consult Note (Signed)
[place] [phone]   Fax:([phone]) [place] NOTE  Patient Care Team: Marliss Coots, NP as PCP - General Lorenso Courier Verita Lamb, MD as Consulting Physician (Hematology and Oncology)  Hematological/Oncological History # Intrahepatic Cholangiocarcinoma of the Liver, Locally Advanced 1) 02/16/2020: presented to the emergency department with diffuse abdominal pain. CT abdomen showed multiple irregular low density abnormalities are noted throughout the liver concerning for metastatic disease. Additionally noted to have mildly enlarged retroperitoneal lymph nodes are noted which may be inflammatory or metastatic in origin 2) 02/23/2020: establish care with Dr. Lorenso Courier 3) 03/05/2020: US biopsy shows adenocarcinoma of pancreaticobiliary origin, upper GI vs cholangiocarcinoma. 4) 03/15/2020: presented f/u visit to discuss biopsy results. Found to have Tbili 12.6, Alk phos 724, AST 124, ALT 65, Cr 2.0. Sent to the ED to assess for obstruction.  CHIEF COMPLAINTS/PURPOSE OF CONSULTATION:  "elevated LFTs, concern for biliary obstruction "  HISTORY OF PRESENTING ILLNESS:  [name] 64 y.o. 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   On exam today her [name] notes that he is unchanged from yesterday.  He is not having any abdominal pain, nausea, vomiting, or diarrhea.  He remains jaundiced but is not having any issues with itching.  Fortunately other than his decreased energy and decline in functional status he is otherwise asymptomatic.  He is aware of the results of his MRI and his ultrasound and does not have any questions or concerns moving forward.  He currently denies having any fevers, chills, sweats.  A full 10 point ROS is listed below.  The bulk of our discussion today was focused on his prognosis and options moving  forward.  I noted that unfortunately without any biliary obstruction it is apparent that his liver is undergoing destruction as a result of the tumor.  With his worsening liver function, worsening kidney function, and decline in functional status I am deeply concerned that he is not a viable candidate for any systemic treatments at this time.  The patient voiced his understanding and his family had opportunity to ask questions regarding care moving forward.  MEDICAL HISTORY:  Past Medical History:  Diagnosis Date  . Cancer ([place])   . Diabetes mellitus without complication ([place])   . Hypertension     SURGICAL HISTORY: Past Surgical History:  Procedure Laterality Date  . APPENDECTOMY      SOCIAL HISTORY: Social History   Socioeconomic History  . Marital status: Single    Spouse name: Not on file  . Number of children: Not on file  . Years of education: Not on file  . Highest education level: Not on file  Occupational History  . Not on file  Tobacco Use  . Smoking status: Current Every Day Smoker    Packs/day: 0.50    Years: 40.00    Pack years: 20.00    Types: Cigarettes  . Smokeless tobacco: Never Used  Substance and Sexual Activity  . Alcohol use: No  . Drug use: No    Types: Cocaine    Comment: used cocaine 2 months  . Sexual activity: Not Currently    Comment: no partner in 1 year  Other Topics Concern  . Not on file  Social History Narrative  . Not on file   Social Determinants of Health   Financial Resource Strain:   . Difficulty of Paying Living Expenses:   Food  Insecurity:   . Worried About Charity fundraiser in the Last Year:   . Arboriculturist in the Last Year:   Transportation Needs:   . Film/video editor (Medical):   Marland Kitchen Lack of Transportation (Non-Medical):   Physical Activity:   . Days of Exercise per Week:   . Minutes of Exercise per Session:   Stress:   . Feeling of Stress :   Social Connections:   . Frequency of Communication with  Friends and Family:   . Frequency of Social Gatherings with Friends and Family:   . Attends Religious Services:   . Active Member of Clubs or Organizations:   . Attends Archivist Meetings:   Marland Kitchen Marital Status:   Intimate Partner Violence:   . Fear of Current or Ex-Partner:   . Emotionally Abused:   Marland Kitchen Physically Abused:   . Sexually Abused:     FAMILY HISTORY: Family History  Problem Relation Age of Onset  . Heart disease Mother   . Hypertension Mother   . Diabetes Mother   . Alcohol abuse Father   . Diabetes Brother   . Hypertension Brother   . Stroke Sister   . Hypertension Sister   . Diabetes Sister     ALLERGIES:  has No Known Allergies.  MEDICATIONS:  Current Facility-Administered Medications  Medication Dose Route Frequency Provider Last Rate Last Admin  . enoxaparin (LOVENOX) injection 40 mg  40 mg Subcutaneous Q24H Danford, Suann Larry, MD   40 mg at 03/15/20 1837  . feeding supplement (ENSURE ENLIVE) (ENSURE ENLIVE) liquid 237 mL  237 mL Oral BID BM Danford, Christopher P, MD      . furosemide (LASIX) tablet 40 mg  40 mg Oral Daily Danford, Suann Larry, MD   40 mg at 03/16/20 1007  . hydrALAZINE (APRESOLINE) tablet 25 mg  25 mg Oral Q6H PRN Danford, Christopher P, MD      . insulin aspart (novoLOG) injection 0-15 Units  0-15 Units Subcutaneous TID WC Edwin Dada, MD   2 Units at 03/16/20 1251  . insulin aspart (novoLOG) injection 0-5 Units  0-5 Units Subcutaneous QHS Danford, Christopher P, MD      . insulin glargine (LANTUS) injection 12 Units  12 Units Subcutaneous QHS Edwin Dada, MD   12 Units at 03/15/20 2151  . ondansetron (ZOFRAN) tablet 4 mg  4 mg Oral Q6H PRN Danford, Suann Larry, MD       Or  . ondansetron (ZOFRAN) injection 4 mg  4 mg Intravenous Q6H PRN Danford, Suann Larry, MD      . oxyCODONE (Oxy IR/ROXICODONE) immediate release tablet 5 mg  5 mg Oral Q6H PRN Edwin Dada, MD   5 mg at 03/16/20 0804  .  pantoprazole (PROTONIX) EC tablet 40 mg  40 mg Oral Daily Danford, Suann Larry, MD   40 mg at 03/16/20 1006  . polyethylene glycol (MIRALAX / GLYCOLAX) packet 17 g  17 g Oral Daily PRN Danford, Suann Larry, MD      . potassium chloride SA (KLOR-CON) CR tablet 20 mEq  20 mEq Oral Daily Danford, Suann Larry, MD   20 mEq at 03/16/20 1007    REVIEW OF SYSTEMS:   Constitutional: ( - ) fevers, ( - )  chills , ( - ) night sweats Eyes: ( - ) blurriness of vision, ( - ) double vision, ( - ) watery eyes Ears, nose, mouth, throat, and face: ( - )  mucositis, ( - ) sore throat Respiratory: ( - ) cough, ( - ) dyspnea, ( - ) wheezes Cardiovascular: ( - ) palpitation, ( - ) chest discomfort, ( - ) lower extremity swelling Gastrointestinal:  ( - ) nausea, ( - ) heartburn, ( - ) change in bowel habits Skin: ( - ) abnormal skin rashes Lymphatics: ( - ) new lymphadenopathy, ( - ) easy bruising Neurological: ( - ) numbness, ( - ) tingling, ( - ) new weaknesses Behavioral/Psych: ( - ) mood change, ( - ) new changes  All other systems were reviewed with the patient and are negative.  PHYSICAL EXAMINATION: ECOG PERFORMANCE STATUS: 2 - Symptomatic, <50% confined to bed  Vitals:   03/16/20 0215 03/16/20 0553  BP: 137/88 (!) 130/100  Pulse: 88 79  Resp: 16 16  Temp: 97.8 F (36.6 C) 98.2 F (36.8 C)  SpO2: 100% 97%   Filed Weights   03/15/20 1626  Weight: 215 lb 13.3 oz (97.9 kg)    GENERAL: ill appearing middle aged [place] American male. Alert, no distress and comfortable SKIN: skin color, texture, turgor are normal, no rashes or significant lesions EYES: conjunctiva are pink and non-injected, sclera jaundiced.  LUNGS: clear to auscultation and percussion with normal breathing effort HEART: regular rate & rhythm and no murmurs and + 2 bilateral  lower extremity pitting edema ABDOMEN: soft, non-tender, non-distended, normal bowel sounds.  Musculoskeletal: no cyanosis of digits and no clubbing    PSYCH: alert & oriented x 3, fluent speech NEURO: no focal motor/sensory deficits  LABORATORY DATA:  I have reviewed the data as listed CBC Latest Ref Rng & Units 03/16/2020 03/15/2020 03/05/2020  WBC 4.0 - 10.5 K/uL 17.6(H) 17.7(H) 17.7(H)  Hemoglobin 13.0 - 17.0 g/dL 11.9(L) 11.7(L) 12.7(L)  Hematocrit 39 - 52 % 35.4(L) 35.1(L) 37.3(L)  Platelets 150 - 400 K/uL 390 366 401(H)    CMP Latest Ref Rng & Units 03/16/2020 03/15/2020 03/05/2020  Glucose 70 - 99 mg/dL 162(H) 213(H) 177(H)  BUN 8 - 23 mg/dL 39(H) 36(H) 28(H)  Creatinine 0.61 - 1.24 mg/dL 1.82(H) 2.06(H) 1.65(H)  Sodium 135 - 145 mmol/L 137 137 133(L)  Potassium 3.5 - 5.1 mmol/L 4.0 3.7 3.8  Chloride 98 - 111 mmol/L 96(L) 97(L) 93(L)  CO2 22 - 32 mmol/L '28 26 24  '$ Calcium 8.9 - 10.3 mg/dL 9.1 9.5 8.9  Total Protein 6.5 - 8.1 g/dL 7.1 7.2 7.5  Total Bilirubin 0.3 - 1.2 mg/dL 12.6(H) 12.6(HH) 8.6(H)  Alkaline Phos 38 - 126 U/L 666(H) 724(H) 602(H)  AST 15 - 41 U/L 117(H) 124(H) 111(H)  ALT 0 - 44 U/L 63(H) 65(H) 74(H)    RADIOGRAPHIC STUDIES: I have personally reviewed the radiological images as listed and agreed with the findings in the report: no clear evidence of biliary obstruction.   CT Chest W Contrast  Result Date: 03/02/2020 CLINICAL DATA:  Indeterminate hepatic lesion identified on CT. Concern for malignancy. EXAM: CT CHEST WITH CONTRAST TECHNIQUE: Multidetector CT imaging of the chest was performed during intravenous contrast administration. CONTRAST:  30m OMNIPAQUE IOHEXOL 300 MG/ML  SOLN COMPARISON:  CT 02/16/2020 FINDINGS: Cardiovascular: No significant vascular findings. Normal heart size. No pericardial effusion. Mediastinum/Nodes: No axillary supraclavicular adenopathy no mediastinal hilar adenopathy no pericardial effusion. Esophagus normal. Lungs/Pleura: Small RIGHT upper lobe pulmonary nodule measures 4 mm (image 45/series 7). Mild band of atelectasis in the RIGHT lower lobe. No LEFT lung nodularity. Upper  Abdomen: Lesions described within the liver on comparison CT are  less conspicuous on chest CT. Liver has nodular contour. Small amount of free fluid. Musculoskeletal: No aggressive osseous lesion. Degenerative osteophytosis of the spine. IMPRESSION: 1. No evidence of primary malignancy in the thorax. 2. Small RIGHT upper lobe pulmonary nodule. No follow-up needed if patient is low-risk. Non-contrast chest CT can be considered in 12 months if patient is high-risk. This recommendation follows the consensus statement: Guidelines for Management of Incidental Pulmonary Nodules Detected on CT Images: From the Fleischner Society 2017; Radiology 2017; 284:228-243. 3. Hepatic lesions less well defined on chest CT. Small intraperitoneal free fluid. Electronically Signed   By: Suzy Bouchard M.D.   On: 03/02/2020 08:41   US Abdomen Complete  Result Date: 03/15/2020 CLINICAL DATA:  64 year old male with jaundice. Abdomen CT suspicious for metastatic disease to the liver last month. Now status post ultrasound-guided liver biopsy 10 days ago. EXAM: ABDOMEN ULTRASOUND COMPLETE COMPARISON:  CT Abdomen and Pelvis 02/16/2020. Ultrasound biopsy images 03/05/2020. FINDINGS: Gallbladder: Generalized gallbladder wall thickening, 5-6 mm. Confluent sludge within the gallbladder which appears partially contracted (image 5). No pericholecystic fluid. No sonographic Murphy sign elicited. Common bile duct: Diameter: 8 mm, mildly dilated and increased from the CT last month (less than 6 mm at that time). Liver: Innumerable hypodense lesions throughout the liver (images 9, 22). Mildly nodular liver contour. Perihepatic ascites (image 27). The largest lesions are up to 32 mm diameter. Portal vein is patent on color Doppler imaging with normal direction of blood flow towards the liver. IVC: No abnormality visualized. Pancreas: Incompletely visualized due to overlying bowel gas, visualized portions within normal limits. (Image 81). Spleen: No  splenomegaly. Splenic length about a 8.2 cm. Splenic echogenicity within normal limits. Right Kidney: Length: 11.5 cm. Echogenicity within normal limits. No mass or hydronephrosis visualized. Left Kidney: Length: 11.3 cm. A larger left renal midpole cyst by CT is poorly visible today (it image 109), but a much smaller 12 mm benign appearing exophytic cyst is identified on image 114. Cortical echogenicity is normal. No hydronephrosis. Abdominal aorta: No aneurysm visualized. Other findings: Small - or at most moderate - volume of ascites primarily visible in the right upper quadrant. IMPRESSION: 1. Mildly dilated CBD (8 mm) is increased from the CT last month, although no obstructing etiology is identified by ultrasound. 2. Diffuse liver metastatic disease again noted. Small volume right upper quadrant ascites. 3. Gallbladder sludge and wall thickening likely secondary to #2. Electronically Signed   By: Genevie Ann M.D.   On: 03/15/2020 19:09   CT Abdomen Pelvis W Contrast  Result Date: 02/16/2020 CLINICAL DATA:  Acute generalized abdominal pain. EXAM: CT ABDOMEN AND PELVIS WITH CONTRAST TECHNIQUE: Multidetector CT imaging of the abdomen and pelvis was performed using the standard protocol following bolus administration of intravenous contrast. CONTRAST:  159m OMNIPAQUE IOHEXOL 300 MG/ML  SOLN COMPARISON:  None. FINDINGS: Lower chest: No acute abnormality. Hepatobiliary: No gallstones or biliary dilatation is noted. Multiple irregular low density abnormalities are noted throughout the liver concerning for metastatic disease. These are most prominently seen in the left hepatic lobe. Pancreas: Unremarkable. No pancreatic ductal dilatation or surrounding inflammatory changes. Spleen: Normal in size without focal abnormality. Adrenals/Urinary Tract: Adrenal glands appear normal. Left renal cyst is noted. No hydronephrosis or renal obstruction is noted. Urinary bladder is unremarkable. Stomach/Bowel: The stomach appears  normal. There is no evidence of bowel obstruction or inflammation. Status post appendectomy. Vascular/Lymphatic: Atherosclerosis of abdominal aorta is noted without aneurysm or dissection. Mildly enlarged lymph nodes are noted in  the retroperitoneal region which may be inflammatory or metastatic in origin. Reproductive: Moderately enlarged prostate gland is noted. Other: Mild ascites is noted.  No definite hernia is noted. Musculoskeletal: No acute or significant osseous findings. IMPRESSION: 1. Multiple irregular low density abnormalities are noted throughout the liver concerning for metastatic disease. These are most prominently seen in the left hepatic lobe. Further evaluation with MRI with and without gadolinium administration is recommended. 2. Mildly enlarged retroperitoneal lymph nodes are noted which may be inflammatory or metastatic in origin. 3. Mild ascites is noted. 4. Moderately enlarged prostate gland. Aortic Atherosclerosis (ICD10-I70.0). Electronically Signed   By: Marijo Conception M.D.   On: 02/16/2020 16:42   MR ABDOMEN MRCP WO CONTRAST  Result Date: 03/16/2020 CLINICAL DATA:  64 year old with history of painless jaundice. Weight loss. Possible metastatic disease to the liver noted on recent CT and abdominal ultrasound. EXAM: MRI ABDOMEN WITHOUT CONTRAST  (INCLUDING MRCP) TECHNIQUE: Multiplanar multisequence MR imaging of the abdomen was performed. Heavily T2-weighted images of the biliary and pancreatic ducts were obtained, and three-dimensional MRCP images were rendered by post processing. COMPARISON:  No prior abdominal MRI. CT the abdomen and pelvis 02/16/2020. Abdominal ultrasound 03/15/2020. FINDINGS: Comment: Today's study is limited for detection and characterization of visceral and/or vascular lesions by lack of IV gadolinium. Lower chest: Unremarkable. Hepatobiliary: Widespread areas of T2 signal intensity and diffusion restriction scattered throughout the hepatic parenchyma,  suggestive of diffuse metastatic disease. This is essentially completely confluent involving the caudate lobe and left side of the liver (segments 1, 2, 3, 4A and 4B). No intra or extrahepatic biliary ductal dilatation. Gallbladder is normal in appearance. Common bile duct measures 3 mm in the porta hepatis. MRCP images are limited by patient motion, but with this limitation in mind, no definite filling defect within the common bile duct to suggest choledocholithiasis. Pancreas: No definite pancreatic mass noted on today's noncontrast examination. No pancreatic ductal dilatation. Spleen:  Unremarkable. Adrenals/Urinary Tract: T1 hypointense, T2 hyperintense lesions in the kidneys bilaterally, incompletely characterized on today's noncontrast examination, but statistically likely to represent cysts, largest of which is in the medial aspect of the interpolar region of the left kidney (axial image 24 of series 15) measuring 2.7 x 2.7 cm. No hydroureteronephrosis in the visualized portions of the abdomen. Bilateral adrenal glands are normal in appearance. Stomach/Bowel: Visualized portions are unremarkable. Vascular/Lymphatic: No aneurysm identified in the visualized abdominal vasculature. No definite lymphadenopathy confidently identified on today's noncontrast examination. Other:  Moderate to large volume of ascites. Musculoskeletal: No aggressive appearing osseous lesions are noted in the visualized portions of the skeleton. IMPRESSION: 1. Progressive metastatic disease throughout the liver, most confluent in the left side of the liver, with increasing moderate to large volume of ascites. 2. No findings to suggest biliary tract obstruction. Electronically Signed   By: Vinnie Langton M.D.   On: 03/16/2020 08:36   MR 3D Recon At Scanner  Result Date: 03/16/2020 CLINICAL DATA:  64 year old with history of painless jaundice. Weight loss. Possible metastatic disease to the liver noted on recent CT and abdominal  ultrasound. EXAM: MRI ABDOMEN WITHOUT CONTRAST  (INCLUDING MRCP) TECHNIQUE: Multiplanar multisequence MR imaging of the abdomen was performed. Heavily T2-weighted images of the biliary and pancreatic ducts were obtained, and three-dimensional MRCP images were rendered by post processing. COMPARISON:  No prior abdominal MRI. CT the abdomen and pelvis 02/16/2020. Abdominal ultrasound 03/15/2020. FINDINGS: Comment: Today's study is limited for detection and characterization of visceral and/or vascular lesions by lack  of IV gadolinium. Lower chest: Unremarkable. Hepatobiliary: Widespread areas of T2 signal intensity and diffusion restriction scattered throughout the hepatic parenchyma, suggestive of diffuse metastatic disease. This is essentially completely confluent involving the caudate lobe and left side of the liver (segments 1, 2, 3, 4A and 4B). No intra or extrahepatic biliary ductal dilatation. Gallbladder is normal in appearance. Common bile duct measures 3 mm in the porta hepatis. MRCP images are limited by patient motion, but with this limitation in mind, no definite filling defect within the common bile duct to suggest choledocholithiasis. Pancreas: No definite pancreatic mass noted on today's noncontrast examination. No pancreatic ductal dilatation. Spleen:  Unremarkable. Adrenals/Urinary Tract: T1 hypointense, T2 hyperintense lesions in the kidneys bilaterally, incompletely characterized on today's noncontrast examination, but statistically likely to represent cysts, largest of which is in the medial aspect of the interpolar region of the left kidney (axial image 24 of series 15) measuring 2.7 x 2.7 cm. No hydroureteronephrosis in the visualized portions of the abdomen. Bilateral adrenal glands are normal in appearance. Stomach/Bowel: Visualized portions are unremarkable. Vascular/Lymphatic: No aneurysm identified in the visualized abdominal vasculature. No definite lymphadenopathy confidently identified on  today's noncontrast examination. Other:  Moderate to large volume of ascites. Musculoskeletal: No aggressive appearing osseous lesions are noted in the visualized portions of the skeleton. IMPRESSION: 1. Progressive metastatic disease throughout the liver, most confluent in the left side of the liver, with increasing moderate to large volume of ascites. 2. No findings to suggest biliary tract obstruction. Electronically Signed   By: Vinnie Langton M.D.   On: 03/16/2020 08:36   US BIOPSY (LIVER)  Result Date: 03/05/2020 INDICATION: 64 year old with markedly abnormal liver enzymes and diffuse disease throughout the liver which is suggestive for multiple liver lesions. Tissue diagnosis is needed. Patient also has perihepatic ascites. EXAM: ULTRASOUND-GUIDED PARACENTESIS ULTRASOUND-GUIDED LIVER LESION BIOPSY MEDICATIONS: None. ANESTHESIA/SEDATION: Moderate (conscious) sedation was employed during this procedure. A total of Versed 2.0 mg and Fentanyl 100 mcg was administered intravenously. Moderate Sedation Time: 31 minutes minutes. The patient's level of consciousness and vital signs were monitored continuously by radiology nursing throughout the procedure under my direct supervision. FLUOROSCOPY TIME:  None COMPLICATIONS: None immediate. PROCEDURE: Informed written consent was obtained from the patient after a thorough discussion of the procedural risks, benefits and alternatives. All questions were addressed. A timeout was performed prior to the initiation of the procedure. The right side of the abdomen was evaluated with ultrasound. Perihepatic ascites was identified. The right side of the abdomen was prepped with chlorhexidine and sterile field was created. Maximal barrier sterile technique was utilized including caps, mask, sterile gowns, sterile gloves, sterile drape, hand hygiene and skin antiseptic. Skin was anesthetized with 1% lidocaine. Small incision was made. Using ultrasound guidance, a  Safe-T-Centesis catheter was directed into the perihepatic ascites. Yellow-green ascites was removed. Approximately 1 L of fluid was removed. After the paracentesis, attention was directed to the liver biopsy. Lesion in the right hepatic lobe was targeted for biopsy. Overlying skin was anesthetized with 1% lidocaine. A small incision was made. Using ultrasound guidance, 17 gauge coaxial needle was directed into the right hepatic lobe and into the lesion. Total of 4 core biopsies were obtained. Core specimens were obtained of the lesion and surrounding parenchyma. Specimens placed in formalin. Small amount of Gel-Foam was injected along the needle track as a Gel-Foam slurry. Needle was removed without complication. Follow-up ultrasound images were obtained. FINDINGS: Perihepatic ascites. 1 L of yellow-green ascites was removed the perihepatic  space. Lesion in the right hepatic lobe was targeted for biopsy. No immediate bleeding or hematoma formation following the core biopsies. Patient has innumerable lesions scattered throughout the liver. Small amount of residual ascites along the inferior aspect of the liver after the biopsy and paracentesis. No significant ascites at the biopsy site. IMPRESSION: Ultrasound-guided paracentesis and ultrasound-guided liver lesion biopsy. Core specimens were placed in formalin. 1 L of ascites was obtained and fluid was sent for cytology. Patient's total bilirubin is markedly elevated, measuring 8.6 and previously measuring 3.3. Referring physician Dr. Lorenso Courier was made aware of these findings. Electronically Signed   By: Markus Daft M.D.   On: 03/05/2020 16:05   US Paracentesis  Result Date: 03/05/2020 INDICATION: 64 year old with markedly abnormal liver enzymes and diffuse disease throughout the liver which is suggestive for multiple liver lesions. Tissue diagnosis is needed. Patient also has perihepatic ascites. EXAM: ULTRASOUND-GUIDED PARACENTESIS ULTRASOUND-GUIDED LIVER LESION  BIOPSY MEDICATIONS: None. ANESTHESIA/SEDATION: Moderate (conscious) sedation was employed during this procedure. A total of Versed 2.0 mg and Fentanyl 100 mcg was administered intravenously. Moderate Sedation Time: 31 minutes minutes. The patient's level of consciousness and vital signs were monitored continuously by radiology nursing throughout the procedure under my direct supervision. FLUOROSCOPY TIME:  None COMPLICATIONS: None immediate. PROCEDURE: Informed written consent was obtained from the patient after a thorough discussion of the procedural risks, benefits and alternatives. All questions were addressed. A timeout was performed prior to the initiation of the procedure. The right side of the abdomen was evaluated with ultrasound. Perihepatic ascites was identified. The right side of the abdomen was prepped with chlorhexidine and sterile field was created. Maximal barrier sterile technique was utilized including caps, mask, sterile gowns, sterile gloves, sterile drape, hand hygiene and skin antiseptic. Skin was anesthetized with 1% lidocaine. Small incision was made. Using ultrasound guidance, a Safe-T-Centesis catheter was directed into the perihepatic ascites. Yellow-green ascites was removed. Approximately 1 L of fluid was removed. After the paracentesis, attention was directed to the liver biopsy. Lesion in the right hepatic lobe was targeted for biopsy. Overlying skin was anesthetized with 1% lidocaine. A small incision was made. Using ultrasound guidance, 17 gauge coaxial needle was directed into the right hepatic lobe and into the lesion. Total of 4 core biopsies were obtained. Core specimens were obtained of the lesion and surrounding parenchyma. Specimens placed in formalin. Small amount of Gel-Foam was injected along the needle track as a Gel-Foam slurry. Needle was removed without complication. Follow-up ultrasound images were obtained. FINDINGS: Perihepatic ascites. 1 L of yellow-green ascites  was removed the perihepatic space. Lesion in the right hepatic lobe was targeted for biopsy. No immediate bleeding or hematoma formation following the core biopsies. Patient has innumerable lesions scattered throughout the liver. Small amount of residual ascites along the inferior aspect of the liver after the biopsy and paracentesis. No significant ascites at the biopsy site. IMPRESSION: Ultrasound-guided paracentesis and ultrasound-guided liver lesion biopsy. Core specimens were placed in formalin. 1 L of ascites was obtained and fluid was sent for cytology. Patient's total bilirubin is markedly elevated, measuring 8.6 and previously measuring 3.3. Referring physician Dr. Lorenso Courier was made aware of these findings. Electronically Signed   By: Markus Daft M.D.   On: 03/05/2020 16:05    ASSESSMENT & PLAN [name] 64 y.o. 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  After review the labs, review the imaging,  discussion with the patient the findings are consistent with rapidly worsening liver function with concurrent kidney dysfunction.  Given these findings and how rapidly has progressed I do not believe that the patient would be a viable candidate for systemic treatment for his malignancy.  I was very clear with the patient that his life expectancy would be that of weeks to months and that there is nothing we can do to reverse his imminent liver failure.  I also noted that his kidney function is declining as well.  The patient was open and agreeable to comfort based care and palliative medicine with hospice care.  The hospitalist I have discussed the case and he will begin the process of having the patient connected with the services.  We are happy to continue providing care for Mr. Christel Mormon we will plan to see him back in clinic in approximately 4 to 6 weeks time to assess how  he is doing and to help with symptom management.  If the patient needs to be seen more quickly we are happy to have him return to clinic prior to that time.  # Intrahepatic Cholangiocarcinoma of the Liver, Locally Advanced #Worsening Liver Function  --no signs of biliary obstruction on imaging. No GI intervention required as there is no clear indication --elevation in bilirubin and LFTs are 2/2 to local destruction of the liver by his malignancy --unfortunately with his rapidly worsening liver disease, decreased functional status, and worsening kidney function we are unable to offer chemotherapy  --at this time I would recommend pursuing comfort based care with hospice/palliative care.  --Oncology will continue to be involved with his care as needed. Plan for f/u in our clinic in 4-6 weeks time to re-evaluate and assist in symptom management. --we appreciate the excellent care of the primary team and consulting services in this difficult case.   All questions were answered. The patient knows to call the clinic with any problems, questions or concerns.  A total of more than 55 minutes were spent on this encounter and over half of that time was spent on counseling and coordination of care as outlined above.   Ledell Peoples, MD Department of Hematology/Oncology [place] [facility] Phone: [phone] Pager: [phone] Email: [email or web address]  03/16/2020 2:48 PM

## 2020-03-16 NOTE — Consult Note (Signed)
Referring Provider: Oncology Primary Care Physician:  Marliss Coots, NP Primary Gastroenterologist: Althia Forts  Reason for Consultation: Jaundice, cholangiocarcinoma  HPI: [name] is a 64 y.o. male was sent to emergency room from oncology clinic for further evaluation of jaundice.  Patient initially presented to emergency room on February 16, 2020 with diffuse abdominal pain.  CT abdomen showed multiple liver lesion concerning for metastatic disease.  Underwent ultrasound-guided liver biopsy on March 05, 2020 which showed adenocarcinoma with differential of pancreaticobiliary and upper GI origin versus cholangiocarcinoma.  Patient's bilirubin was around 3.3 on June 21 which increased to 8.6 on July 2 and now it is 12.6.  LFTs relatively stable with alkaline phosphatase of around 666, AST of 63 and ALT of 117.  Creatinine is 1.82.  CBC shows mild anemia with hemoglobin of 11.9 and leukocytosis which has been present since June 14.  Ultrasound abdomen showed mild dilated CBD at 8 mm and diffuse liver metastases.  MRI MRCP showed diffuse liver metastatic disease.  No intra or extrahepatic biliary dilatation.  Common bile duct measuring 3 mm.  Patient seen and examined at bedside.  Family at bedside.  He denies any abdominal pain, nausea or vomiting.  Denies diarrhea or constipation.  Denies blood in the stool or black stool.  He is complaining of back pain and right shoulder pain.  Past Medical History:  Diagnosis Date  . Cancer ([place])   . Diabetes mellitus without complication ([place])   . Hypertension     Past Surgical History:  Procedure Laterality Date  . APPENDECTOMY      Prior to Admission medications   Medication Sig Start Date End Date Taking? Authorizing Provider  acetaminophen (TYLENOL) 325 MG tablet Take 325 mg by mouth every 6 (six) hours as needed for mild pain or headache.   Yes [provider]  aspirin-sod bicarb-citric acid (ALKA-SELTZER) 325 MG TBEF tablet Take  325 mg by mouth every 6 (six) hours as needed (congestion).   Yes [provider]  atorvastatin (LIPITOR) 40 MG tablet Take 1 tablet (40 mg total) by mouth daily at 6 PM. 11/01/15  Yes Dettinger, Fransisca Kaufmann, MD  insulin NPH-regular Human (70-30) 100 UNIT/ML injection Inject 10 Units into the skin daily.    Yes [provider]  lisinopril (ZESTRIL) 40 MG tablet Take 40 mg by mouth daily. 01/26/20  Yes [provider]  omeprazole (PRILOSEC) 20 MG capsule Take 20 mg by mouth daily. 01/26/20  Yes [provider]  oxyCODONE (ROXICODONE) 5 MG immediate release tablet Take 1 tablet (5 mg total) by mouth every 6 (six) hours as needed for up to 15 doses for severe pain or breakthrough pain. Patient not taking: Reported on 02/23/2020 02/16/20   Lennice Sites, DO    Scheduled Meds: . enoxaparin (LOVENOX) injection  40 mg Subcutaneous Q24H  . furosemide  40 mg Oral Daily  . insulin aspart  0-15 Units Subcutaneous TID WC  . insulin aspart  0-5 Units Subcutaneous QHS  . insulin glargine  12 Units Subcutaneous QHS  . pantoprazole  40 mg Oral Daily  . potassium chloride  20 mEq Oral Daily   Continuous Infusions: PRN Meds:.hydrALAZINE, ondansetron **OR** ondansetron (ZOFRAN) IV, oxyCODONE, polyethylene glycol  Allergies as of 03/15/2020  . (No Known Allergies)    Family History  Problem Relation Age of Onset  . Heart disease Mother   . Hypertension Mother   . Diabetes Mother   . Alcohol abuse Father   . Diabetes  Brother   . Hypertension Brother   . Stroke Sister   . Hypertension Sister   . Diabetes Sister     Social History   Socioeconomic History  . Marital status: Single    Spouse name: Not on file  . Number of children: Not on file  . Years of education: Not on file  . Highest education level: Not on file  Occupational History  . Not on file  Tobacco Use  . Smoking status: Current Every Day Smoker    Packs/day: 0.50    Years: 40.00    Pack years:  20.00    Types: Cigarettes  . Smokeless tobacco: Never Used  Substance and Sexual Activity  . Alcohol use: No  . Drug use: No    Types: Cocaine    Comment: used cocaine 2 months  . Sexual activity: Not Currently    Comment: no partner in 1 year  Other Topics Concern  . Not on file  Social History Narrative  . Not on file   Social Determinants of Health   Financial Resource Strain:   . Difficulty of Paying Living Expenses:   Food Insecurity:   . Worried About Charity fundraiser in the Last Year:   . Arboriculturist in the Last Year:   Transportation Needs:   . Film/video editor (Medical):   Marland Kitchen Lack of Transportation (Non-Medical):   Physical Activity:   . Days of Exercise per Week:   . Minutes of Exercise per Session:   Stress:   . Feeling of Stress :   Social Connections:   . Frequency of Communication with Friends and Family:   . Frequency of Social Gatherings with Friends and Family:   . Attends Religious Services:   . Active Member of Clubs or Organizations:   . Attends Archivist Meetings:   Marland Kitchen Marital Status:   Intimate Partner Violence:   . Fear of Current or Ex-Partner:   . Emotionally Abused:   Marland Kitchen Physically Abused:   . Sexually Abused:     Review of Systems: All negative except as stated above in HPI.  Physical Exam: Vital signs: Vitals:   03/16/20 0215 03/16/20 0553  BP: 137/88 (!) 130/100  Pulse: 88 79  Resp: 16 16  Temp: 97.8 F (36.6 C) 98.2 F (36.8 C)  SpO2: 100% 97%   Last BM Date: 03/15/20 Physical Exam Vitals reviewed.  Constitutional:      Appearance: Normal appearance.  HENT:     Head: Normocephalic and atraumatic.     Nose: Nose normal.     Mouth/Throat:     Mouth: Mucous membranes are moist.     Pharynx: Oropharynx is clear. No oropharyngeal exudate.  Eyes:     General: Scleral icterus present.     Extraocular Movements: Extraocular movements intact.  Cardiovascular:     Rate and Rhythm: Normal rate and  regular rhythm.     Heart sounds: No murmur heard.   Pulmonary:     Effort: Pulmonary effort is normal. No respiratory distress.  Abdominal:     General: Bowel sounds are normal. There is no distension.     Palpations: Abdomen is soft.     Tenderness: There is no abdominal tenderness. There is no guarding.  Musculoskeletal:        General: Swelling present. Normal range of motion.     Cervical back: Normal range of motion.  Skin:    General: Skin is warm.  Coloration: Skin is jaundiced.  Neurological:     Mental Status: He is alert and oriented to person, place, and time.  Psychiatric:        Mood and Affect: Mood normal.        Thought Content: Thought content normal.        Judgment: Judgment normal.     GI:  Lab Results: Recent Labs    03/15/20 1025 03/16/20 0654  WBC 17.7* 17.6*  HGB 11.7* 11.9*  HCT 35.1* 35.4*  PLT 366 390   BMET Recent Labs    03/15/20 1025 03/16/20 0654  NA 137 137  K 3.7 4.0  CL 97* 96*  CO2 26 28  GLUCOSE 213* 162*  BUN 36* 39*  CREATININE 2.06* 1.82*  CALCIUM 9.5 9.1   LFT Recent Labs    03/16/20 0654  PROT 7.1  ALBUMIN 2.3*  AST 117*  ALT 63*  ALKPHOS 666*  BILITOT 12.6*   PT/INR Recent Labs    03/15/20 1026  LABPROT 13.4  INR 1.1     Studies/Results: US Abdomen Complete  Result Date: 03/15/2020 CLINICAL DATA:  64 year old male with jaundice. Abdomen CT suspicious for metastatic disease to the liver last month. Now status post ultrasound-guided liver biopsy 10 days ago. EXAM: ABDOMEN ULTRASOUND COMPLETE COMPARISON:  CT Abdomen and Pelvis 02/16/2020. Ultrasound biopsy images 03/05/2020. FINDINGS: Gallbladder: Generalized gallbladder wall thickening, 5-6 mm. Confluent sludge within the gallbladder which appears partially contracted (image 5). No pericholecystic fluid. No sonographic Murphy sign elicited. Common bile duct: Diameter: 8 mm, mildly dilated and increased from the CT last month (less than 6 mm at that  time). Liver: Innumerable hypodense lesions throughout the liver (images 9, 22). Mildly nodular liver contour. Perihepatic ascites (image 27). The largest lesions are up to 32 mm diameter. Portal vein is patent on color Doppler imaging with normal direction of blood flow towards the liver. IVC: No abnormality visualized. Pancreas: Incompletely visualized due to overlying bowel gas, visualized portions within normal limits. (Image 81). Spleen: No splenomegaly. Splenic length about a 8.2 cm. Splenic echogenicity within normal limits. Right Kidney: Length: 11.5 cm. Echogenicity within normal limits. No mass or hydronephrosis visualized. Left Kidney: Length: 11.3 cm. A larger left renal midpole cyst by CT is poorly visible today (it image 109), but a much smaller 12 mm benign appearing exophytic cyst is identified on image 114. Cortical echogenicity is normal. No hydronephrosis. Abdominal aorta: No aneurysm visualized. Other findings: Small - or at most moderate - volume of ascites primarily visible in the right upper quadrant. IMPRESSION: 1. Mildly dilated CBD (8 mm) is increased from the CT last month, although no obstructing etiology is identified by ultrasound. 2. Diffuse liver metastatic disease again noted. Small volume right upper quadrant ascites. 3. Gallbladder sludge and wall thickening likely secondary to #2. Electronically Signed   By: Genevie Ann M.D.   On: 03/15/2020 19:09   MR ABDOMEN MRCP WO CONTRAST  Result Date: 03/16/2020 CLINICAL DATA:  63 year old with history of painless jaundice. Weight loss. Possible metastatic disease to the liver noted on recent CT and abdominal ultrasound. EXAM: MRI ABDOMEN WITHOUT CONTRAST  (INCLUDING MRCP) TECHNIQUE: Multiplanar multisequence MR imaging of the abdomen was performed. Heavily T2-weighted images of the biliary and pancreatic ducts were obtained, and three-dimensional MRCP images were rendered by post processing. COMPARISON:  No prior abdominal MRI. CT the  abdomen and pelvis 02/16/2020. Abdominal ultrasound 03/15/2020. FINDINGS: Comment: Today's study is limited for detection and characterization of visceral and/or  vascular lesions by lack of IV gadolinium. Lower chest: Unremarkable. Hepatobiliary: Widespread areas of T2 signal intensity and diffusion restriction scattered throughout the hepatic parenchyma, suggestive of diffuse metastatic disease. This is essentially completely confluent involving the caudate lobe and left side of the liver (segments 1, 2, 3, 4A and 4B). No intra or extrahepatic biliary ductal dilatation. Gallbladder is normal in appearance. Common bile duct measures 3 mm in the porta hepatis. MRCP images are limited by patient motion, but with this limitation in mind, no definite filling defect within the common bile duct to suggest choledocholithiasis. Pancreas: No definite pancreatic mass noted on today's noncontrast examination. No pancreatic ductal dilatation. Spleen:  Unremarkable. Adrenals/Urinary Tract: T1 hypointense, T2 hyperintense lesions in the kidneys bilaterally, incompletely characterized on today's noncontrast examination, but statistically likely to represent cysts, largest of which is in the medial aspect of the interpolar region of the left kidney (axial image 24 of series 15) measuring 2.7 x 2.7 cm. No hydroureteronephrosis in the visualized portions of the abdomen. Bilateral adrenal glands are normal in appearance. Stomach/Bowel: Visualized portions are unremarkable. Vascular/Lymphatic: No aneurysm identified in the visualized abdominal vasculature. No definite lymphadenopathy confidently identified on today's noncontrast examination. Other:  Moderate to large volume of ascites. Musculoskeletal: No aggressive appearing osseous lesions are noted in the visualized portions of the skeleton. IMPRESSION: 1. Progressive metastatic disease throughout the liver, most confluent in the left side of the liver, with increasing moderate to  large volume of ascites. 2. No findings to suggest biliary tract obstruction. Electronically Signed   By: Vinnie Langton M.D.   On: 03/16/2020 08:36   MR 3D Recon At Scanner  Result Date: 03/16/2020 CLINICAL DATA:  64 year old with history of painless jaundice. Weight loss. Possible metastatic disease to the liver noted on recent CT and abdominal ultrasound. EXAM: MRI ABDOMEN WITHOUT CONTRAST  (INCLUDING MRCP) TECHNIQUE: Multiplanar multisequence MR imaging of the abdomen was performed. Heavily T2-weighted images of the biliary and pancreatic ducts were obtained, and three-dimensional MRCP images were rendered by post processing. COMPARISON:  No prior abdominal MRI. CT the abdomen and pelvis 02/16/2020. Abdominal ultrasound 03/15/2020. FINDINGS: Comment: Today's study is limited for detection and characterization of visceral and/or vascular lesions by lack of IV gadolinium. Lower chest: Unremarkable. Hepatobiliary: Widespread areas of T2 signal intensity and diffusion restriction scattered throughout the hepatic parenchyma, suggestive of diffuse metastatic disease. This is essentially completely confluent involving the caudate lobe and left side of the liver (segments 1, 2, 3, 4A and 4B). No intra or extrahepatic biliary ductal dilatation. Gallbladder is normal in appearance. Common bile duct measures 3 mm in the porta hepatis. MRCP images are limited by patient motion, but with this limitation in mind, no definite filling defect within the common bile duct to suggest choledocholithiasis. Pancreas: No definite pancreatic mass noted on today's noncontrast examination. No pancreatic ductal dilatation. Spleen:  Unremarkable. Adrenals/Urinary Tract: T1 hypointense, T2 hyperintense lesions in the kidneys bilaterally, incompletely characterized on today's noncontrast examination, but statistically likely to represent cysts, largest of which is in the medial aspect of the interpolar region of the left kidney (axial  image 24 of series 15) measuring 2.7 x 2.7 cm. No hydroureteronephrosis in the visualized portions of the abdomen. Bilateral adrenal glands are normal in appearance. Stomach/Bowel: Visualized portions are unremarkable. Vascular/Lymphatic: No aneurysm identified in the visualized abdominal vasculature. No definite lymphadenopathy confidently identified on today's noncontrast examination. Other:  Moderate to large volume of ascites. Musculoskeletal: No aggressive appearing osseous lesions are noted in  the visualized portions of the skeleton. IMPRESSION: 1. Progressive metastatic disease throughout the liver, most confluent in the left side of the liver, with increasing moderate to large volume of ascites. 2. No findings to suggest biliary tract obstruction. Electronically Signed   By: Vinnie Langton M.D.   On: 03/16/2020 08:36    Impression/Plan: -Jaundice in setting of metastatic disease versus locally advancing cholangiocarcinoma.  MRI negative for any obstructive disease.  Elevation in bilirubin is most likely from progression of metastatic disease in the liver.  Recommendations ------------------------- -MRI negative for intra and extrahepatic biliary ductal dilation.  Unfortunately patient's worsening jaundice is most likely from progression of hepatic metastatic disease.I Do not think doing ERCP with stent placement will improve his jaundice.  -Consider trial of ursodiol, 300 mg twice daily if okay with oncology. This was discussed with patient and patient's family.  -No further inpatient GI work-up planned. GI will sign off. Call us back if needed    LOS: 0 days   Otis Brace  MD, [place] 03/16/2020, 8:43 AM  Contact #  [phone]

## 2020-03-16 NOTE — TOC Initial Note (Signed)
Transition of Care [facility]) - Initial/Assessment Note    Patient Details  Name: [name] MRN: [id number] Date of Birth: 1956-04-16  [facility] CM/SW Contact:    Lynnell Catalan, RN Phone Number: 03/16/2020, 4:00 PM  Clinical Narrative:                 This CM met with pt and sister at bedside for dc planning. Home hospice choice offered and [facility] chosen. [facility] liaison contacted for referral. Pt would like RW,3in1 and hospital bed for home.   Expected Discharge Plan: Home w Hospice Care Barriers to Discharge: No Barriers Identified  Expected Discharge Plan and Services Expected Discharge Plan: [place]   Discharge Planning Services: CM Consult Post Acute Care Choice: Hospice   Expected Discharge Date: 03/16/20                         HH Arranged: Disease Management HH Agency: [facility] and [place] Date HH Agency Contacted: 03/16/20 Time HH Agency Contacted: 61 Representative spoke with at [place]: [place] Arrangements/Services   Lives with:: Self Patient language and need for interpreter reviewed:: Yes Do you feel safe going back to the place where you live?: Yes      Need for Family Participation in Patient Care: Yes (Comment) Care giver support system in place?: Yes (comment)   Criminal Activity/Legal Involvement Pertinent to Current Situation/Hospitalization: No - Comment as needed  Activities of Daily Living Home Assistive Devices/Equipment: Eyeglasses, CBG Meter, Cane (specify quad or straight) (single point cane) ADL Screening (condition at time of admission) Patient's cognitive ability adequate to safely complete daily activities?: Yes Is the patient deaf or have difficulty hearing?: No Does the patient have difficulty seeing, even when wearing glasses/contacts?: No Does the patient have difficulty concentrating, remembering, or making decisions?: No Patient able to express need  for assistance with ADLs?: Yes Does the patient have difficulty dressing or bathing?: No Independently performs ADLs?: Yes (appropriate for developmental age) Does the patient have difficulty walking or climbing stairs?: Yes Weakness of Legs: Both Weakness of Arms/Hands: None  Permission Sought/Granted   Permission granted to share information with : Yes, Verbal Permission Granted     Permission granted to share info w AGENCY: [facility]        Emotional Assessment Appearance:: Appears stated age Attitude/Demeanor/Rapport: Self-Confident Affect (typically observed): Calm Orientation: : Oriented to Self, Oriented to Place, Oriented to  Time, Oriented to Situation      Admission diagnosis:  Jaundice [R17] Patient Active Problem List   Diagnosis Date Noted  . Hospice care patient 03/16/2020  . Jaundice 03/15/2020  . AKI (acute kidney injury) ([place]) 03/15/2020  . CKD (chronic kidney disease), stage III 03/15/2020  . Microcytic anemia 03/15/2020  . Lower leg crush injury 07/11/2013  . Essential hypertension 07/09/2013  . Rhabdomyolysis 07/09/2013  . Type 2 diabetes mellitus with diabetic nephropathy, with long-term current use of insulin ([place]) 07/08/2013  . Right leg injury 07/08/2013  . Tobacco abuse 07/08/2013  . Polysubstance abuse, in remission 07/08/2013   PCP:  Marliss Coots, NP Pharmacy:   [facility] [address] [place] [address] Phone: [phone] Fax: [phone]     Social Determinants of Health (SDOH) Interventions    Readmission Risk Interventions No flowsheet data found.

## 2020-03-16 NOTE — Progress Notes (Signed)
Patient discharged to home with sister and brother, discharge instructions reviewed with patient who verbalized understanding. New RX's sent electronically to pharmacy.

## 2020-03-16 NOTE — Discharge Summary (Signed)
Physician Discharge Summary  [name] BEM:[id number] DOB: 01-26-1956 DOA: 03/15/2020  PCP: Edward Coots, NP  Admit date: 03/15/2020 Discharge date: 03/16/2020  Admitted From: Home  Disposition:  Home with Hospice   Recommendations for Outpatient Follow-up:  1. Follow up with Hospice 2. Follow up with Dr. Lorenso Alvarez in 4-6 weeks for symptom management    Home Health: None  Equipment/Devices: None  Discharge Condition: Fair  CODE STATUS: FULL Diet recommendation: Regular  Brief/Interim Summary: [name] is a 64 y.o. M with hx DM, HTN, and recently dx'd metastatic cholangiocarcinoma who presented with progressive fatigue and jaundice.    Since his biopsy, the patient has had progressive new leg swelling, fatigue, dark urine and icterus. Presented for first post-biopsy visit to Oncology/Dr. Lorenso Alvarez, who found Tbili 12 (up from 8 one week ago) and elevated Alk phos and so patient was referred to ER for rule out obstruction.         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  GI were consulted, felt no ERCP intervention would be helpful.  Oncology were consulted, felt his cancer was too aggressive for chemo, recommended Hospice.  Hospice was arranged for post-discharge follow up at home.     Type 2 diabetes, with diabetic nephropathy, insulin-dependent, uncontrolled  Hypertension Stop lisinopril.  CKD stage IIIb AKI ruled out.  Baseline creatinine 1.6-1.8. Creatinine slightly up here, improved with oral furosemide.  Discharged with few more days the PRN oral furosemide.             Discharge Instructions  Discharge Instructions    Diet - low sodium heart healthy   Complete by: As directed     Discharge instructions   Complete by: As directed    From Dr. Loleta Alvarez: You were admitted for worsening liver function tests.  The images showed that this was from your cancer getting worse.  This is happening quickly, I am so sorry.  For now, work with Hospice to spend some time enjoying your life. You look swollen in the legs -- so for the next 3 days, take furosemide Lasix 40 mg once daily in the morning  After three days, stop Lasix and only take it if you are VERY swollen or if the Hospice nurses recommend it   You should also NOT take your lisinopril.  Throw this medicine (old blood pressure medicine away) You do not need to take atorvastatin either  You may take omeprazole if you need it You may take oxycodone if you need it   Increase activity slowly   Complete by: As directed      Allergies as of 03/16/2020   No Known Allergies     Medication List    STOP taking these medications   atorvastatin 40 MG tablet Commonly known as: LIPITOR   lisinopril 40 MG tablet Commonly known as: ZESTRIL   omeprazole 20 MG capsule Commonly known as: PRILOSEC     TAKE these medications   acetaminophen 325 MG tablet Commonly known as: TYLENOL Take 325 mg by mouth every 6 (six) hours as needed for mild pain or headache.   aspirin-sod bicarb-citric acid 325 MG Tbef tablet Commonly known as: ALKA-SELTZER Take 325 mg by mouth every 6 (six) hours as needed (congestion).   furosemide 40 MG tablet Commonly known as: LASIX Take 1 tablet (  40 mg total) by mouth daily as needed for fluid. Notes to patient: DR WANTS YOU TO TAKE EVERY MORNING FOR 3 DAYS, THEN IF NEEDED FOR SWELLING   insulin NPH-regular Human (70-30) 100 UNIT/ML injection Inject 10 Units into the skin daily.   oxyCODONE 5 MG immediate release tablet Commonly known as: Roxicodone Take 1 tablet (5 mg total) by mouth every 6 (six) hours as needed for up to 15 doses for severe pain or breakthrough pain.       No Known  Allergies  Consultations:  Oncology  GI   Procedures/Studies: CT Chest W Contrast  Result Date: 03/02/2020 CLINICAL DATA:  Indeterminate hepatic lesion identified on CT. Concern for malignancy. EXAM: CT CHEST WITH CONTRAST TECHNIQUE: Multidetector CT imaging of the chest was performed during intravenous contrast administration. CONTRAST:  59m OMNIPAQUE IOHEXOL 300 MG/ML  SOLN COMPARISON:  CT 02/16/2020 FINDINGS: Cardiovascular: No significant vascular findings. Normal heart size. No pericardial effusion. Mediastinum/Nodes: No axillary supraclavicular adenopathy no mediastinal hilar adenopathy no pericardial effusion. Esophagus normal. Lungs/Pleura: Small RIGHT upper lobe pulmonary nodule measures 4 mm (image 45/series 7). Mild band of atelectasis in the RIGHT lower lobe. No LEFT lung nodularity. Upper Abdomen: Lesions described within the liver on comparison CT are less conspicuous on chest CT. Liver has nodular contour. Small amount of free fluid. Musculoskeletal: No aggressive osseous lesion. Degenerative osteophytosis of the spine. IMPRESSION: 1. No evidence of primary malignancy in the thorax. 2. Small RIGHT upper lobe pulmonary nodule. No follow-up needed if patient is low-risk. Non-contrast chest CT can be considered in 12 months if patient is high-risk. This recommendation follows the consensus statement: Guidelines for Management of Incidental Pulmonary Nodules Detected on CT Images: From the Fleischner Society 2017; Radiology 2017; 284:228-243. 3. Hepatic lesions less well defined on chest CT. Small intraperitoneal free fluid. Electronically Signed   By: SSuzy BouchardM.D.   On: 03/02/2020 08:41   UKoreaAbdomen Complete  Result Date: 03/15/2020 CLINICAL DATA:  64year old male with jaundice. Abdomen CT suspicious for metastatic disease to the liver last month. Now status post ultrasound-guided liver biopsy 10 days ago. EXAM: ABDOMEN ULTRASOUND COMPLETE COMPARISON:  CT Abdomen and Pelvis  02/16/2020. Ultrasound biopsy images 03/05/2020. FINDINGS: Gallbladder: Generalized gallbladder wall thickening, 5-6 mm. Confluent sludge within the gallbladder which appears partially contracted (image 5). No pericholecystic fluid. No sonographic Murphy sign elicited. Common bile duct: Diameter: 8 mm, mildly dilated and increased from the CT last month (less than 6 mm at that time). Liver: Innumerable hypodense lesions throughout the liver (images 9, 22). Mildly nodular liver contour. Perihepatic ascites (image 27). The largest lesions are up to 32 mm diameter. Portal vein is patent on color Doppler imaging with normal direction of blood flow towards the liver. IVC: No abnormality visualized. Pancreas: Incompletely visualized due to overlying bowel gas, visualized portions within normal limits. (Image 81). Spleen: No splenomegaly. Splenic length about a 8.2 cm. Splenic echogenicity within normal limits. Right Kidney: Length: 11.5 cm. Echogenicity within normal limits. No mass or hydronephrosis visualized. Left Kidney: Length: 11.3 cm. A larger left renal midpole cyst by CT is poorly visible today (it image 109), but a much smaller 12 mm benign appearing exophytic cyst is identified on image 114. Cortical echogenicity is normal. No hydronephrosis. Abdominal aorta: No aneurysm visualized. Other findings: Small - or at most moderate - volume of ascites primarily visible in the right upper quadrant. IMPRESSION: 1. Mildly dilated CBD (8 mm) is increased from the CT last month, although no  obstructing etiology is identified by ultrasound. 2. Diffuse liver metastatic disease again noted. Small volume right upper quadrant ascites. 3. Gallbladder sludge and wall thickening likely secondary to #2. Electronically Signed   By: Genevie Ann M.D.   On: 03/15/2020 19:09   CT Abdomen Pelvis W Contrast  Result Date: 02/16/2020 CLINICAL DATA:  Acute generalized abdominal pain. EXAM: CT ABDOMEN AND PELVIS WITH CONTRAST TECHNIQUE:  Multidetector CT imaging of the abdomen and pelvis was performed using the standard protocol following bolus administration of intravenous contrast. CONTRAST:  120m OMNIPAQUE IOHEXOL 300 MG/ML  SOLN COMPARISON:  None. FINDINGS: Lower chest: No acute abnormality. Hepatobiliary: No gallstones or biliary dilatation is noted. Multiple irregular low density abnormalities are noted throughout the liver concerning for metastatic disease. These are most prominently seen in the left hepatic lobe. Pancreas: Unremarkable. No pancreatic ductal dilatation or surrounding inflammatory changes. Spleen: Normal in size without focal abnormality. Adrenals/Urinary Tract: Adrenal glands appear normal. Left renal cyst is noted. No hydronephrosis or renal obstruction is noted. Urinary bladder is unremarkable. Stomach/Bowel: The stomach appears normal. There is no evidence of bowel obstruction or inflammation. Status post appendectomy. Vascular/Lymphatic: Atherosclerosis of abdominal aorta is noted without aneurysm or dissection. Mildly enlarged lymph nodes are noted in the retroperitoneal region which may be inflammatory or metastatic in origin. Reproductive: Moderately enlarged prostate gland is noted. Other: Mild ascites is noted.  No definite hernia is noted. Musculoskeletal: No acute or significant osseous findings. IMPRESSION: 1. Multiple irregular low density abnormalities are noted throughout the liver concerning for metastatic disease. These are most prominently seen in the left hepatic lobe. Further evaluation with MRI with and without gadolinium administration is recommended. 2. Mildly enlarged retroperitoneal lymph nodes are noted which may be inflammatory or metastatic in origin. 3. Mild ascites is noted. 4. Moderately enlarged prostate gland. Aortic Atherosclerosis (ICD10-I70.0). Electronically Signed   By: JMarijo ConceptionM.D.   On: 02/16/2020 16:42   MR ABDOMEN MRCP WO CONTRAST  Result Date: 03/16/2020 CLINICAL DATA:   64year old with history of painless jaundice. Weight loss. Possible metastatic disease to the liver noted on recent CT and abdominal ultrasound. EXAM: MRI ABDOMEN WITHOUT CONTRAST  (INCLUDING MRCP) TECHNIQUE: Multiplanar multisequence MR imaging of the abdomen was performed. Heavily T2-weighted images of the biliary and pancreatic ducts were obtained, and three-dimensional MRCP images were rendered by post processing. COMPARISON:  No prior abdominal MRI. CT the abdomen and pelvis 02/16/2020. Abdominal ultrasound 03/15/2020. FINDINGS: Comment: Today's study is limited for detection and characterization of visceral and/or vascular lesions by lack of IV gadolinium. Lower chest: Unremarkable. Hepatobiliary: Widespread areas of T2 signal intensity and diffusion restriction scattered throughout the hepatic parenchyma, suggestive of diffuse metastatic disease. This is essentially completely confluent involving the caudate lobe and left side of the liver (segments 1, 2, 3, 4A and 4B). No intra or extrahepatic biliary ductal dilatation. Gallbladder is normal in appearance. Common bile duct measures 3 mm in the porta hepatis. MRCP images are limited by patient motion, but with this limitation in mind, no definite filling defect within the common bile duct to suggest choledocholithiasis. Pancreas: No definite pancreatic mass noted on today's noncontrast examination. No pancreatic ductal dilatation. Spleen:  Unremarkable. Adrenals/Urinary Tract: T1 hypointense, T2 hyperintense lesions in the kidneys bilaterally, incompletely characterized on today's noncontrast examination, but statistically likely to represent cysts, largest of which is in the medial aspect of the interpolar region of the left kidney (axial image 24 of series 15) measuring 2.7 x 2.7 cm. No  hydroureteronephrosis in the visualized portions of the abdomen. Bilateral adrenal glands are normal in appearance. Stomach/Bowel: Visualized portions are unremarkable.  Vascular/Lymphatic: No aneurysm identified in the visualized abdominal vasculature. No definite lymphadenopathy confidently identified on today's noncontrast examination. Other:  Moderate to large volume of ascites. Musculoskeletal: No aggressive appearing osseous lesions are noted in the visualized portions of the skeleton. IMPRESSION: 1. Progressive metastatic disease throughout the liver, most confluent in the left side of the liver, with increasing moderate to large volume of ascites. 2. No findings to suggest biliary tract obstruction. Electronically Signed   By: Vinnie Langton M.D.   On: 03/16/2020 08:36   MR 3D Recon At Scanner  Result Date: 03/16/2020 CLINICAL DATA:  64 year old with history of painless jaundice. Weight loss. Possible metastatic disease to the liver noted on recent CT and abdominal ultrasound. EXAM: MRI ABDOMEN WITHOUT CONTRAST  (INCLUDING MRCP) TECHNIQUE: Multiplanar multisequence MR imaging of the abdomen was performed. Heavily T2-weighted images of the biliary and pancreatic ducts were obtained, and three-dimensional MRCP images were rendered by post processing. COMPARISON:  No prior abdominal MRI. CT the abdomen and pelvis 02/16/2020. Abdominal ultrasound 03/15/2020. FINDINGS: Comment: Today's study is limited for detection and characterization of visceral and/or vascular lesions by lack of IV gadolinium. Lower chest: Unremarkable. Hepatobiliary: Widespread areas of T2 signal intensity and diffusion restriction scattered throughout the hepatic parenchyma, suggestive of diffuse metastatic disease. This is essentially completely confluent involving the caudate lobe and left side of the liver (segments 1, 2, 3, 4A and 4B). No intra or extrahepatic biliary ductal dilatation. Gallbladder is normal in appearance. Common bile duct measures 3 mm in the porta hepatis. MRCP images are limited by patient motion, but with this limitation in mind, no definite filling defect within the common  bile duct to suggest choledocholithiasis. Pancreas: No definite pancreatic mass noted on today's noncontrast examination. No pancreatic ductal dilatation. Spleen:  Unremarkable. Adrenals/Urinary Tract: T1 hypointense, T2 hyperintense lesions in the kidneys bilaterally, incompletely characterized on today's noncontrast examination, but statistically likely to represent cysts, largest of which is in the medial aspect of the interpolar region of the left kidney (axial image 24 of series 15) measuring 2.7 x 2.7 cm. No hydroureteronephrosis in the visualized portions of the abdomen. Bilateral adrenal glands are normal in appearance. Stomach/Bowel: Visualized portions are unremarkable. Vascular/Lymphatic: No aneurysm identified in the visualized abdominal vasculature. No definite lymphadenopathy confidently identified on today's noncontrast examination. Other:  Moderate to large volume of ascites. Musculoskeletal: No aggressive appearing osseous lesions are noted in the visualized portions of the skeleton. IMPRESSION: 1. Progressive metastatic disease throughout the liver, most confluent in the left side of the liver, with increasing moderate to large volume of ascites. 2. No findings to suggest biliary tract obstruction. Electronically Signed   By: Vinnie Langton M.D.   On: 03/16/2020 08:36   US BIOPSY (LIVER)  Result Date: 03/05/2020 INDICATION: 64 year old with markedly abnormal liver enzymes and diffuse disease throughout the liver which is suggestive for multiple liver lesions. Tissue diagnosis is needed. Patient also has perihepatic ascites. EXAM: ULTRASOUND-GUIDED PARACENTESIS ULTRASOUND-GUIDED LIVER LESION BIOPSY MEDICATIONS: None. ANESTHESIA/SEDATION: Moderate (conscious) sedation was employed during this procedure. A total of Versed 2.0 mg and Fentanyl 100 mcg was administered intravenously. Moderate Sedation Time: 31 minutes minutes. The patient's level of consciousness and vital signs were monitored  continuously by radiology nursing throughout the procedure under my direct supervision. FLUOROSCOPY TIME:  None COMPLICATIONS: None immediate. PROCEDURE: Informed written consent was obtained from the patient after  a thorough discussion of the procedural risks, benefits and alternatives. All questions were addressed. A timeout was performed prior to the initiation of the procedure. The right side of the abdomen was evaluated with ultrasound. Perihepatic ascites was identified. The right side of the abdomen was prepped with chlorhexidine and sterile field was created. Maximal barrier sterile technique was utilized including caps, mask, sterile gowns, sterile gloves, sterile drape, hand hygiene and skin antiseptic. Skin was anesthetized with 1% lidocaine. Small incision was made. Using ultrasound guidance, a Safe-T-Centesis catheter was directed into the perihepatic ascites. Yellow-green ascites was removed. Approximately 1 L of fluid was removed. After the paracentesis, attention was directed to the liver biopsy. Lesion in the right hepatic lobe was targeted for biopsy. Overlying skin was anesthetized with 1% lidocaine. A small incision was made. Using ultrasound guidance, 17 gauge coaxial needle was directed into the right hepatic lobe and into the lesion. Total of 4 core biopsies were obtained. Core specimens were obtained of the lesion and surrounding parenchyma. Specimens placed in formalin. Small amount of Gel-Foam was injected along the needle track as a Gel-Foam slurry. Needle was removed without complication. Follow-up ultrasound images were obtained. FINDINGS: Perihepatic ascites. 1 L of yellow-green ascites was removed the perihepatic space. Lesion in the right hepatic lobe was targeted for biopsy. No immediate bleeding or hematoma formation following the core biopsies. Patient has innumerable lesions scattered throughout the liver. Small amount of residual ascites along the inferior aspect of the liver  after the biopsy and paracentesis. No significant ascites at the biopsy site. IMPRESSION: Ultrasound-guided paracentesis and ultrasound-guided liver lesion biopsy. Core specimens were placed in formalin. 1 L of ascites was obtained and fluid was sent for cytology. Patient's total bilirubin is markedly elevated, measuring 8.6 and previously measuring 3.3. Referring physician Dr. Lorenso Alvarez was made aware of these findings. Electronically Signed   By: Markus Daft M.D.   On: 03/05/2020 16:05   US Paracentesis  Result Date: 03/05/2020 INDICATION: 64 year old with markedly abnormal liver enzymes and diffuse disease throughout the liver which is suggestive for multiple liver lesions. Tissue diagnosis is needed. Patient also has perihepatic ascites. EXAM: ULTRASOUND-GUIDED PARACENTESIS ULTRASOUND-GUIDED LIVER LESION BIOPSY MEDICATIONS: None. ANESTHESIA/SEDATION: Moderate (conscious) sedation was employed during this procedure. A total of Versed 2.0 mg and Fentanyl 100 mcg was administered intravenously. Moderate Sedation Time: 31 minutes minutes. The patient's level of consciousness and vital signs were monitored continuously by radiology nursing throughout the procedure under my direct supervision. FLUOROSCOPY TIME:  None COMPLICATIONS: None immediate. PROCEDURE: Informed written consent was obtained from the patient after a thorough discussion of the procedural risks, benefits and alternatives. All questions were addressed. A timeout was performed prior to the initiation of the procedure. The right side of the abdomen was evaluated with ultrasound. Perihepatic ascites was identified. The right side of the abdomen was prepped with chlorhexidine and sterile field was created. Maximal barrier sterile technique was utilized including caps, mask, sterile gowns, sterile gloves, sterile drape, hand hygiene and skin antiseptic. Skin was anesthetized with 1% lidocaine. Small incision was made. Using ultrasound guidance, a  Safe-T-Centesis catheter was directed into the perihepatic ascites. Yellow-green ascites was removed. Approximately 1 L of fluid was removed. After the paracentesis, attention was directed to the liver biopsy. Lesion in the right hepatic lobe was targeted for biopsy. Overlying skin was anesthetized with 1% lidocaine. A small incision was made. Using ultrasound guidance, 17 gauge coaxial needle was directed into the right hepatic lobe and into the lesion.  Total of 4 core biopsies were obtained. Core specimens were obtained of the lesion and surrounding parenchyma. Specimens placed in formalin. Small amount of Gel-Foam was injected along the needle track as a Gel-Foam slurry. Needle was removed without complication. Follow-up ultrasound images were obtained. FINDINGS: Perihepatic ascites. 1 L of yellow-green ascites was removed the perihepatic space. Lesion in the right hepatic lobe was targeted for biopsy. No immediate bleeding or hematoma formation following the core biopsies. Patient has innumerable lesions scattered throughout the liver. Small amount of residual ascites along the inferior aspect of the liver after the biopsy and paracentesis. No significant ascites at the biopsy site. IMPRESSION: Ultrasound-guided paracentesis and ultrasound-guided liver lesion biopsy. Core specimens were placed in formalin. 1 L of ascites was obtained and fluid was sent for cytology. Patient's total bilirubin is markedly elevated, measuring 8.6 and previously measuring 3.3. Referring physician Dr. Lorenso Alvarez was made aware of these findings. Electronically Signed   By: Markus Daft M.D.   On: 03/05/2020 16:05       Subjective: Feeling tired.  N  Discharge Exam: Vitals:   03/16/20 0553 03/16/20 1548  BP: (!) 130/100 (!) 153/102  Pulse: 79 63  Resp: 16 16  Temp: 98.2 F (36.8 C) (!) 97.4 F (36.3 C)  SpO2: 97% 100%   Vitals:   03/15/20 2228 03/16/20 0215 03/16/20 0553 03/16/20 1548  BP: (!) 139/91 137/88 (!) 130/100  (!) 153/102  Pulse: 79 88 79 63  Resp: _0 Temp: 98.2 F (36.8 C) 97.8 F (36.6 C) 98.2 F (36.8 C) (!) 97.4 F (36.3 C)  TempSrc: Oral Oral Oral Oral  SpO2: 99% 100% 97% 100%  Weight:      Height:        General: Pt is alert, awake, not in acute distress, icteric Cardiovascular: RRR, nl S1-S2, no murmurs appreciated.   No LE edema.   Respiratory: Normal respiratory rate and rhythm.  CTAB without rales or wheezes. Abdominal: Abdomen soft and non-tender.  No distension or HSM.   Neuro/Psych: Strength symmetric in upper and lower extremities.  Judgment and insight appear normal.   The results of significant diagnostics from this hospitalization (including imaging, microbiology, ancillary and laboratory) are listed below for reference.     Microbiology: Recent Results (from the past 240 hour(s))  SARS Coronavirus 2 by RT PCR (hospital order, performed in [facility] hospital lab) Nasopharyngeal Nasopharyngeal Swab     Status: None   Collection Time: 03/15/20  1:07 PM   Specimen: Nasopharyngeal Swab  Result Value Ref Range Status   SARS Coronavirus 2 NEGATIVE NEGATIVE Final    Comment: (NOTE) SARS-CoV-2 target nucleic acids are NOT DETECTED.  The SARS-CoV-2 RNA is generally detectable in upper and lower respiratory specimens during the acute phase of infection. The lowest concentration of SARS-CoV-2 viral copies this assay can detect is 250 copies / mL. A negative result does not preclude SARS-CoV-2 infection and should not be used as the sole basis for treatment or other patient management decisions.  A negative result may occur with improper specimen collection / handling, submission of specimen other than nasopharyngeal swab, presence of viral mutation(s) within the areas targeted by this assay, and inadequate number of viral copies (<250 copies / mL). A negative result must be combined with clinical observations, patient history, and epidemiological  information.  Fact Sheet for Patients:   StrictlyIdeas.no  Fact Sheet for Healthcare Providers: BankingDealers.co.za  This test is not yet approved or  cleared  by the [place] and has been authorized for detection and/or diagnosis of SARS-CoV-2 by FDA under an Emergency Use Authorization (EUA).  This EUA will remain in effect (meaning this test can be used) for the duration of the COVID-19 declaration under Section 564(b)(1) of the Act, 21 U.S.C. section 360bbb-3(b)(1), unless the authorization is terminated or revoked sooner.  Performed at [facility], [place] [address] [place], [place] [address]      Labs: BNP (last 3 results) No results for input(s): BNP in the last 8760 hours. Basic Metabolic Panel: Recent Labs  Lab 03/15/20 1025 03/16/20 0654  NA 137 137  K 3.7 4.0  CL 97* 96*  CO2 26 28  GLUCOSE 213* 162*  BUN 36* 39*  CREATININE 2.06* 1.82*  CALCIUM 9.5 9.1   Liver Function Tests: Recent Labs  Lab 03/15/20 1025 03/16/20 0654  AST 124* 117*  ALT 65* 63*  ALKPHOS 724* 666*  BILITOT 12.6* 12.6*  PROT 7.2 7.1  ALBUMIN 2.0* 2.3*   No results for input(s): LIPASE, AMYLASE in the last 168 hours. No results for input(s): AMMONIA in the last 168 hours. CBC: Recent Labs  Lab 03/15/20 1025 03/16/20 0654  WBC 17.7* 17.6*  NEUTROABS 15.5*  --   HGB 11.7* 11.9*  HCT 35.1* 35.4*  MCV 78.7* 81.6  PLT 366 390   Cardiac Enzymes: No results for input(s): CKTOTAL, CKMB, CKMBINDEX, TROPONINI in the last 168 hours. BNP: Invalid input(s): POCBNP CBG: Recent Labs  Lab 03/15/20 1722 03/15/20 2018 03/16/20 0749 03/16/20 1200  GLUCAP 138* 152* 156* 143*   D-Dimer No results for input(s): DDIMER in the last 72 hours. Hgb A1c Recent Labs    03/16/20 0654  HGBA1C 5.9*   Lipid Profile No results for input(s): CHOL, HDL, LDLCALC, TRIG, CHOLHDL, LDLDIRECT in the last 72 hours. Thyroid  function studies No results for input(s): TSH, T4TOTAL, T3FREE, THYROIDAB in the last 72 hours.  Invalid input(s): FREET3 Anemia work up No results for input(s): VITAMINB12, FOLATE, FERRITIN, TIBC, IRON, RETICCTPCT in the last 72 hours. Urinalysis    Component Value Date/Time   COLORURINE AMBER (A) 03/15/2020 1945   APPEARANCEUR CLEAR 03/15/2020 1945   LABSPEC 1.014 03/15/2020 1945   LABSPEC 1.015 09/10/2012 0924   PHURINE 5.0 03/15/2020 1945   GLUCOSEU NEGATIVE 03/15/2020 1945   GLUCOSEU NEGATIVE 09/10/2012 0924   HGBUR NEGATIVE 03/15/2020 1945   BILIRUBINUR MODERATE (A) 03/15/2020 1945   KETONESUR NEGATIVE 03/15/2020 1945   PROTEINUR 100 (A) 03/15/2020 1945   UROBILINOGEN 1.0 07/08/2013 1001   NITRITE NEGATIVE 03/15/2020 1945   LEUKOCYTESUR NEGATIVE 03/15/2020 1945   Sepsis Labs Invalid input(s): PROCALCITONIN,  WBC,  LACTICIDVEN Microbiology Recent Results (from the past 240 hour(s))  SARS Coronavirus 2 by RT PCR (hospital order, performed in [facility] hospital lab) Nasopharyngeal Nasopharyngeal Swab     Status: None   Collection Time: 03/15/20  1:07 PM   Specimen: Nasopharyngeal Swab  Result Value Ref Range Status   SARS Coronavirus 2 NEGATIVE NEGATIVE Final    Comment: (NOTE) SARS-CoV-2 target nucleic acids are NOT DETECTED.  The SARS-CoV-2 RNA is generally detectable in upper and lower respiratory specimens during the acute phase of infection. The lowest concentration of SARS-CoV-2 viral copies this assay can detect is 250 copies / mL. A negative result does not preclude SARS-CoV-2 infection and should not be used as the sole basis for treatment or other patient management decisions.  A negative result may occur with improper specimen  collection / handling, submission of specimen other than nasopharyngeal swab, presence of viral mutation(s) within the areas targeted by this assay, and inadequate number of viral copies (<250 copies / mL). A negative result must be  combined with clinical observations, patient history, and epidemiological information.  Fact Sheet for Patients:   StrictlyIdeas.no  Fact Sheet for Healthcare Providers: BankingDealers.co.za  This test is not yet approved or  cleared by the [place] FDA and has been authorized for detection and/or diagnosis of SARS-CoV-2 by FDA under an Emergency Use Authorization (EUA).  This EUA will remain in effect (meaning this test can be used) for the duration of the COVID-19 declaration under Section 564(b)(1) of the Act, 21 U.S.C. section 360bbb-3(b)(1), unless the authorization is terminated or revoked sooner.  Performed at [facility], [place] [address] [place], [place] [address]      Time coordinating discharge: 35 minutes The [place] controlled substances registry was reviewed for this patient prior to filling the controlled substances script.  Patient active with Hospice.      SIGNED:   Edwin Dada, MD  [facility] 03/16/2020, 11:43 PM

## 2020-03-16 NOTE — Progress Notes (Addendum)
[facility]) Hospital Liaison: RN note    Notified by Transition of Care Manger of patient/family request for [facility] services at home after discharge. Chart and patient information under review by [facility] physician. Hospice eligibility pending currently.    Spoke with Jerrid and his sister Shirlean Mylar to initiate education related to hospice philosophy, services and team approach to care. Patient and sister Shirlean Mylar verbalized understanding of information given.  Per discussion, plan is for discharge to home today.  Please send signed and completed DNR form home with patient/family. Patient will need prescriptions for discharge comfort medications.     DME needs have been discussed, patient currently has the following equipment in the home: none.  Patient/family requests the following DME for delivery to the home: Hospital bed, 3N1, and rolling walker. [place] equipment manager has been notified and will contact DME provider to arrange delivery to the home. Home address has been verified and is correct in the chart. Shirlean Mylar is the family member to contact to arrange time of delivery.     [facility] aware of the above. Please notify [facility] when patient is ready to leave the unit at discharge. (Call [phone] or [phone] after 5pm.) [facility] information and contact numbers given to [place].      Please call with any hospice related questions.     Thank you for this referral.     Clementeen Hoof, RN, [facility] (listed on [place] under [facility]osp[facility] and [facility] [place])  [phone]

## 2020-03-30 ENCOUNTER — Telehealth: Payer: Self-pay | Admitting: *Deleted

## 2020-03-30 NOTE — Telephone Encounter (Signed)
Left VM requesting to move his oxycodone script from [facility] ([facility] Called [facility] and last script for oxycodone 5 mg #120 was sent by Dr. Tomasa Hosteller on 03/29/20 and is "on file". Called [facility] and confirmed he is a hospice patient. Left VM with nurse, Kenney Houseman with sister's request. Called Robin back and left VM to contact [facility] at [phone] and make them aware of [facility] are [facility] and that Dr. Tomasa Hosteller needs to transfer the script since he was the original sender.

## 2020-04-01 ENCOUNTER — Telehealth: Payer: Self-pay | Admitting: Hematology and Oncology

## 2020-04-01 NOTE — Telephone Encounter (Signed)
Scheduled apt per 7/28 sch msg - pt wife is aware of appt date and time

## 2020-04-12 ENCOUNTER — Telehealth: Payer: Self-pay | Admitting: *Deleted

## 2020-04-12 ENCOUNTER — Telehealth: Payer: Self-pay | Admitting: Hematology and Oncology

## 2020-04-12 NOTE — Telephone Encounter (Signed)
Cancelled appts on 8/26 per 8/9 sch message. Pt was not ready to reschedule and will call back when ready.

## 2020-04-12 NOTE — Telephone Encounter (Signed)
Received call from [employer], RN with [facility]. He states pt has been transferred to [facility] for end of life care

## 2020-04-29 ENCOUNTER — Other Ambulatory Visit: Payer: Self-pay

## 2020-04-29 ENCOUNTER — Ambulatory Visit: Payer: Self-pay | Admitting: Hematology and Oncology

## 2020-05-05 DEATH — deceased

## 2020-08-27 IMAGING — MR MR 3D RECON AT SCANNER
10 of 13 series · 38 of 48 positions shown · non-contrast
Comparison: No prior abdominal MRI. CT the abdomen and pelvis
02/16/2020. Abdominal ultrasound 03/15/2020.

CLINICAL DATA: 63-year-old with history of painless jaundice.
Weight loss. Possible metastatic disease to the liver noted on
recent CT and abdominal ultrasound.

EXAM:
MRI ABDOMEN WITHOUT CONTRAST  (INCLUDING MRCP)
TECHNIQUE: Multiplanar multisequence MR imaging of the abdomen was performed.
Heavily T2-weighted images of the biliary and pancreatic ducts were
obtained, and three-dimensional MRCP images were rendered by post
processing.

[Series 3: T2 fat-sat · axial · 6.0mm · 1.25mm/px · z∈[-160,+193]mm · 2 of 50 slices shown]
[im 1/50]
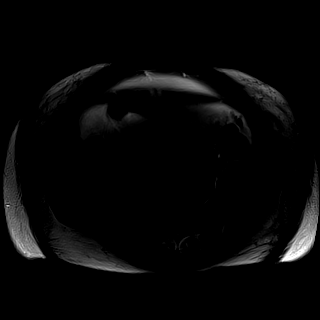
[im 50/50]
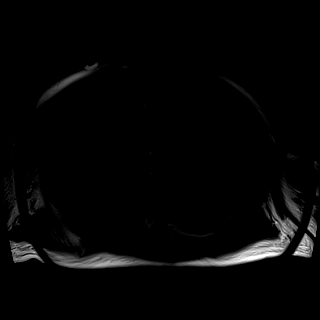

[Series 5: T2 · coronal · 6.0mm · 1.76mm/px · 2 of 40 slices shown (1 of 2)]
[im 1/40]
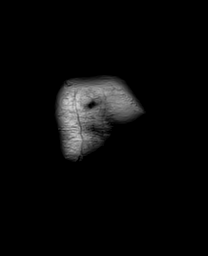
[im 40/40]
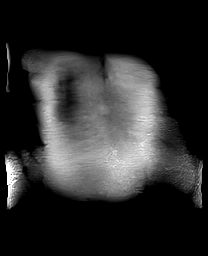

[Series 6: DWI · axial · 6.0mm · 1.68mm/px · z∈[-163,+182]mm · 7 of 98 slices shown (1 of 2)]
[im 1/98]
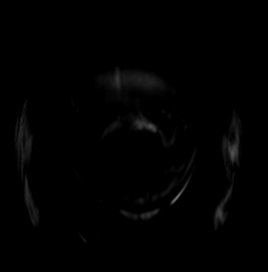
[im 17/98]
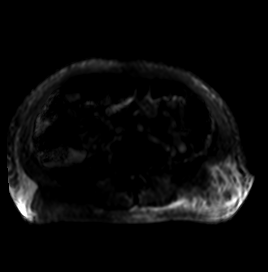
[im 33/98]
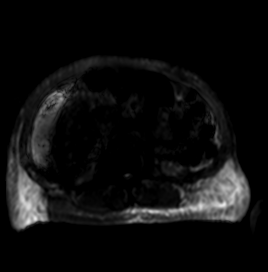
[im 49/98]
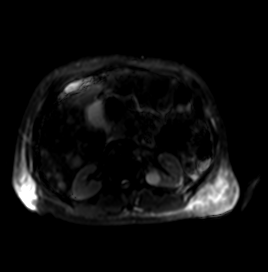
[im 65/98]
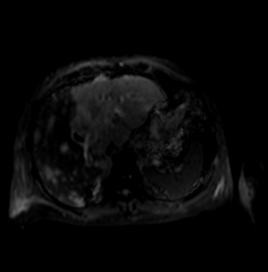
[im 81/98]
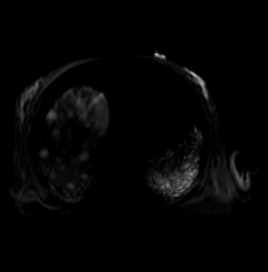
[im 98/98]
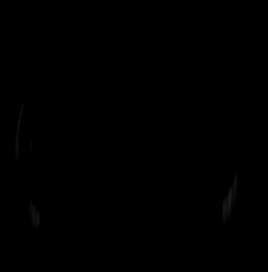

[Series 7: DWI · axial · 6.0mm · 1.68mm/px · z∈[-163,+182]mm · 3 of 49 slices shown (2 of 2)]
[im 1/49]
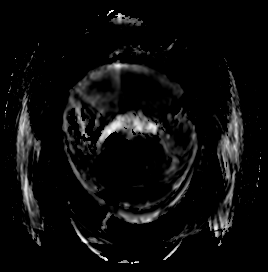
[im 25/49]
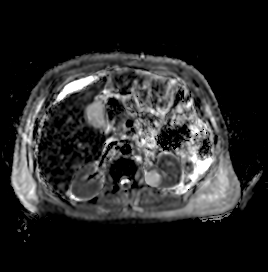
[im 49/49]
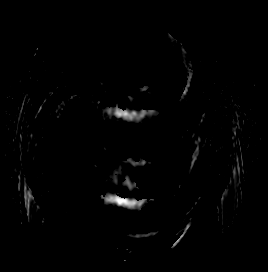

[Series 8: T1 · axial · 3.8mm · 1.41mm/px · z∈[-166,+195]mm · 7 of 96 slices shown (1 of 2)]
[im 1/96]
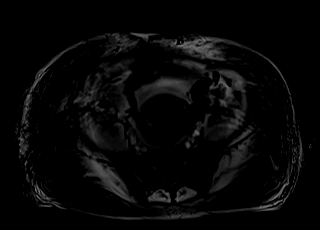
[im 16/96]
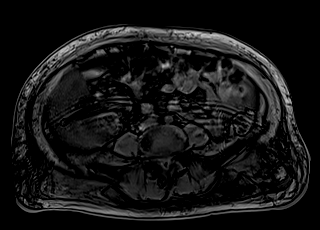
[im 32/96]
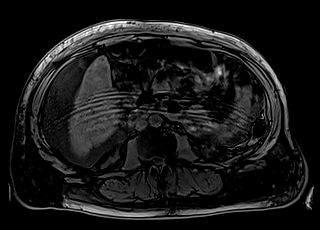
[im 48/96]
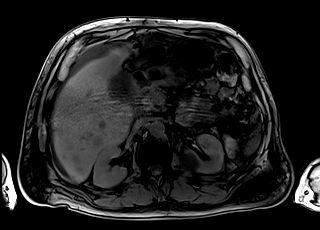
[im 64/96]
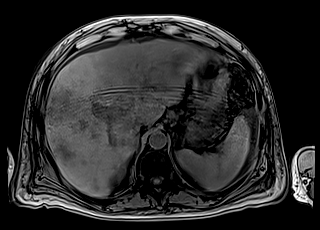
[im 80/96]
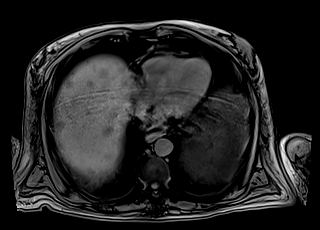
[im 96/96]
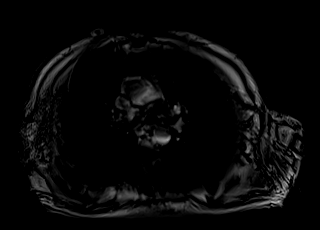

[Series 9: T1 · axial · 3.8mm · 1.41mm/px · z∈[-166,+195]mm · 7 of 96 slices shown (2 of 2)]
[im 1/96]
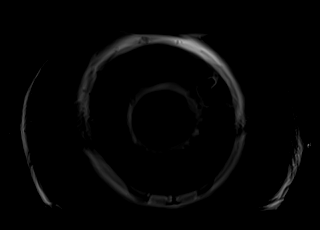
[im 16/96]
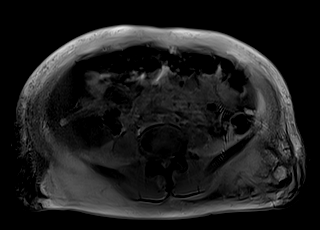
[im 32/96]
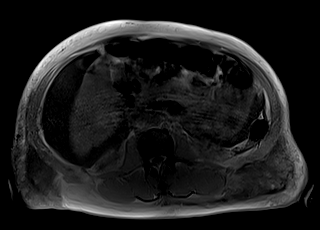
[im 48/96]
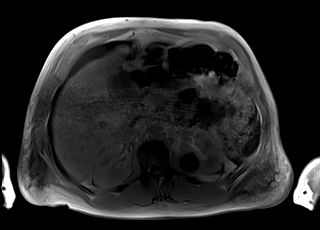
[im 64/96]
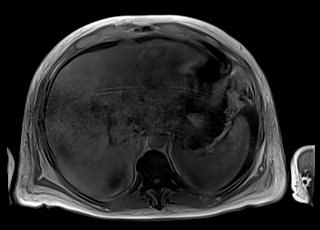
[im 80/96]
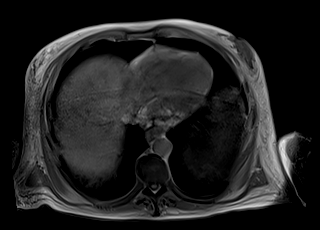
[im 96/96]
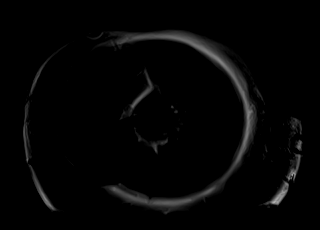

[Series 11: cor obl thk · sagittal · 50.0mm · 0.78mm/px · 1 of 9 slices shown]
[im 1/9]
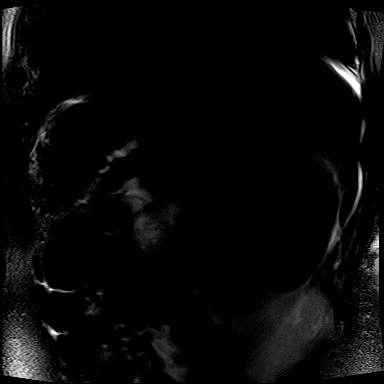

[Series 13: cor_3d_spc_trig · coronal · 1.5mm · 0.59mm/px · 4 of 56 slices shown]
[im 1/56]
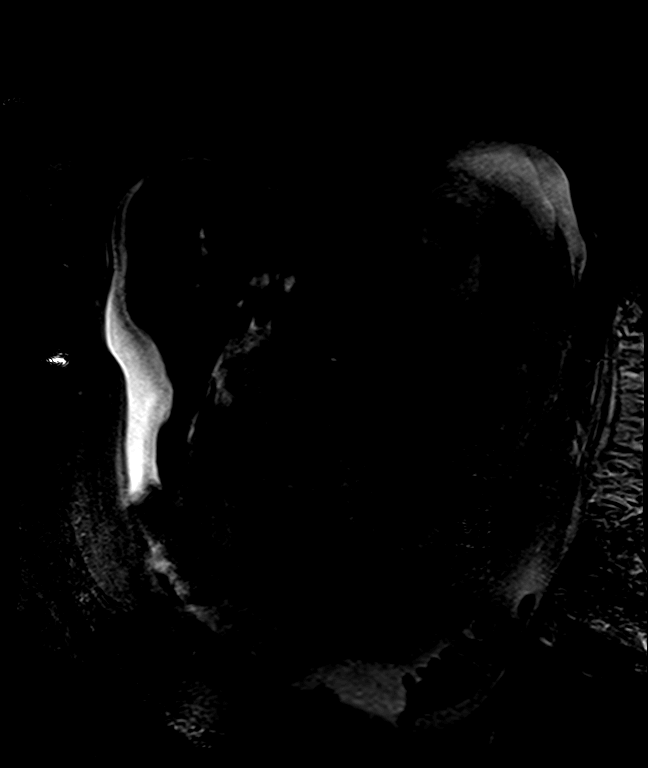
[im 19/56]
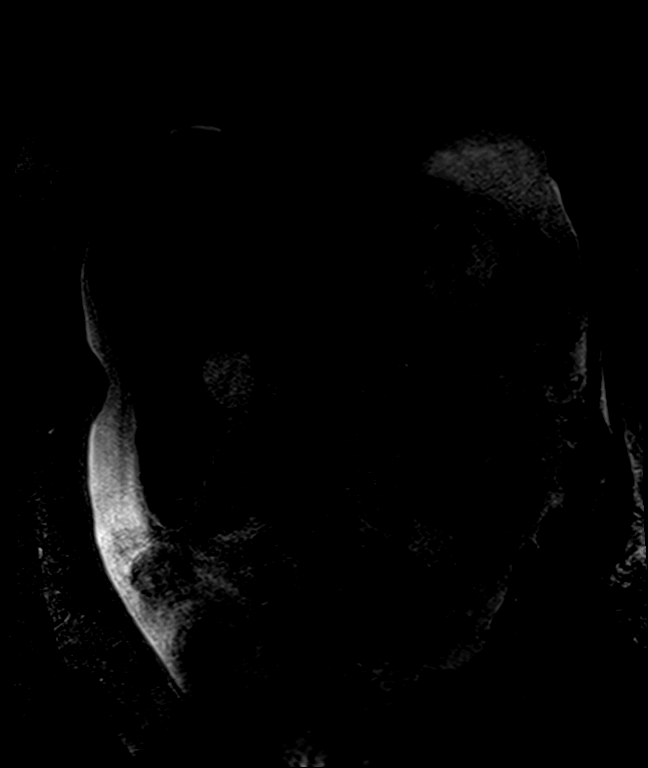
[im 37/56]
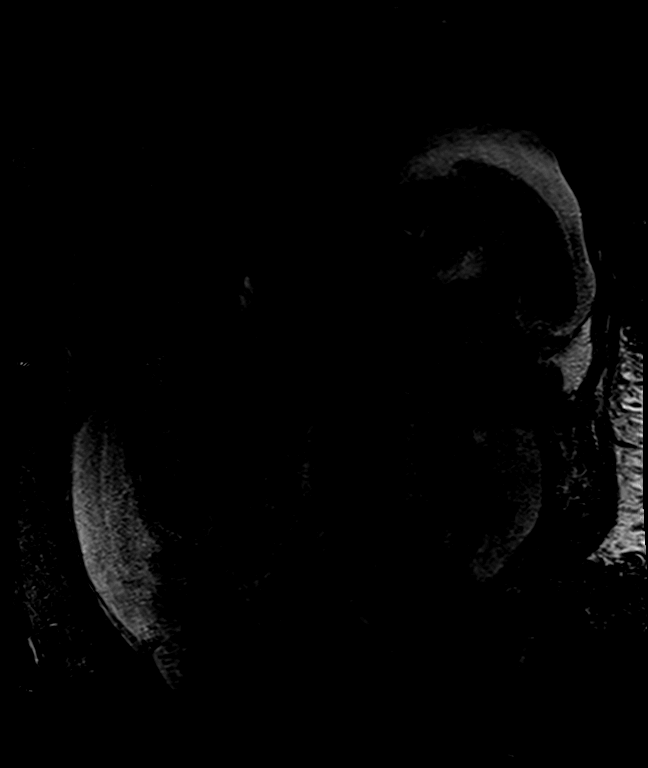
[im 56/56]
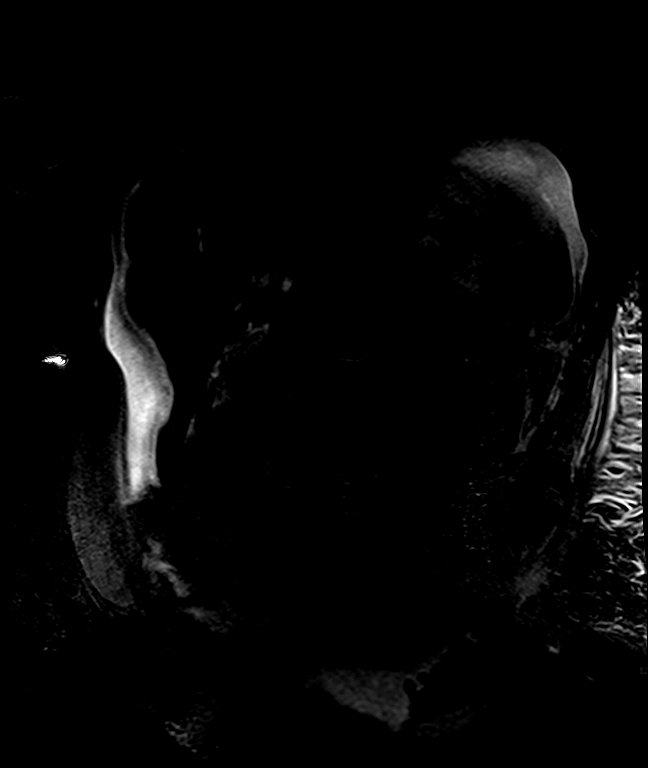

[Series 15: T2 · axial · 6.0mm · 1.76mm/px · z∈[-141,+183]mm · 3 of 46 slices shown (2 of 2)]
[im 1/46]
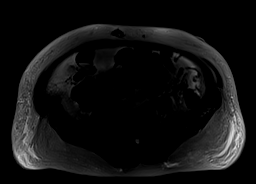
[im 23/46]
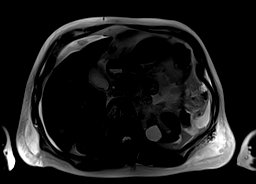
[im 46/46]
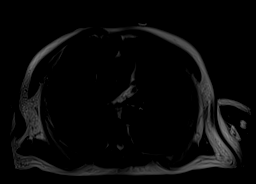

[Series 17: T1 dynamic · axial · 3.6mm · 1.41mm/px · z∈[-158,-104]mm · 2 of 96 slices shown]
[im 1/96]
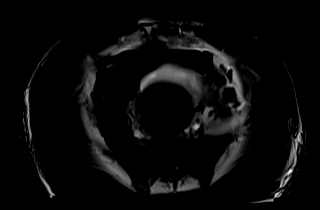
[im 16/96]
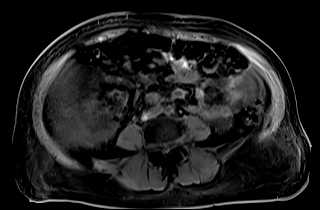

[38 of 48 positions shown; findings below may reference images not displayed]

FINDINGS: Comment: Today's study is limited for detection and characterization
of visceral and/or vascular lesions by lack of IV gadolinium.

Lower chest: Unremarkable.

Hepatobiliary: Widespread areas of T2 signal intensity and diffusion
restriction scattered throughout the hepatic parenchyma, suggestive
of diffuse metastatic disease. This is essentially completely
confluent involving the caudate lobe and left side of the liver
(segments 1, 2, 3, 4A and 4B). No intra or extrahepatic biliary
ductal dilatation. Gallbladder is normal in appearance. Common bile
duct measures 3 mm in the porta hepatis. MRCP images are limited by
patient motion, but with this limitation in mind, no definite
filling defect within the common bile duct to suggest
choledocholithiasis.

Pancreas: No definite pancreatic mass noted on today's noncontrast
examination. No pancreatic ductal dilatation.

Spleen:  Unremarkable.

Adrenals/Urinary Tract: T1 hypointense, T2 hyperintense lesions in
the kidneys bilaterally, incompletely characterized on today's
noncontrast examination, but statistically likely to represent
cysts, largest of which is in the medial aspect of the interpolar
region of the left kidney (axial image 24 of series 15) measuring
2.7 x 2.7 cm. No hydroureteronephrosis in the visualized portions of
the abdomen. Bilateral adrenal glands are normal in appearance.

Stomach/Bowel: Visualized portions are unremarkable.

Vascular/Lymphatic: No aneurysm identified in the visualized
abdominal vasculature. No definite lymphadenopathy confidently
identified on today's noncontrast examination.

Other:  Moderate to large volume of ascites.

Musculoskeletal: No aggressive appearing osseous lesions are noted
in the visualized portions of the skeleton.
IMPRESSION: 1. Progressive metastatic disease throughout the liver, most
confluent in the left side of the liver, with increasing moderate to
large volume of ascites.
2. No findings to suggest biliary tract obstruction.
